# Patient Record
Sex: Female | Born: 1990
Health system: Southern US, Community
[De-identification: ages and names within clinical notes are randomized; demographics above are authoritative.]

## PROBLEM LIST (undated history)

## (undated) ENCOUNTER — Inpatient Hospital Stay (HOSPITAL_COMMUNITY): Payer: Self-pay

## (undated) DIAGNOSIS — E039 Hypothyroidism, unspecified: Secondary | ICD-10-CM

## (undated) DIAGNOSIS — J020 Streptococcal pharyngitis: Secondary | ICD-10-CM

## (undated) DIAGNOSIS — F419 Anxiety disorder, unspecified: Secondary | ICD-10-CM

## (undated) DIAGNOSIS — O24419 Gestational diabetes mellitus in pregnancy, unspecified control: Secondary | ICD-10-CM

## (undated) DIAGNOSIS — F329 Major depressive disorder, single episode, unspecified: Secondary | ICD-10-CM

## (undated) DIAGNOSIS — F32A Depression, unspecified: Secondary | ICD-10-CM

## (undated) HISTORY — DX: Depression, unspecified: F32.A

## (undated) HISTORY — DX: Major depressive disorder, single episode, unspecified: F32.9

## (undated) HISTORY — PX: INTRAUTERINE DEVICE (IUD) INSERTION: SHX5877

## (undated) HISTORY — DX: Streptococcal pharyngitis: J02.0

## (undated) HISTORY — DX: Gestational diabetes mellitus in pregnancy, unspecified control: O24.419

## (undated) HISTORY — DX: Anxiety disorder, unspecified: F41.9

---
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
## 2011-02-09 ENCOUNTER — Emergency Department: Payer: Self-pay | Admitting: Emergency Medicine

## 2011-05-07 ENCOUNTER — Emergency Department: Payer: Self-pay | Admitting: Emergency Medicine

## 2011-09-16 ENCOUNTER — Observation Stay: Payer: Self-pay | Admitting: Obstetrics and Gynecology

## 2011-09-20 ENCOUNTER — Observation Stay: Payer: Self-pay

## 2011-09-21 ENCOUNTER — Observation Stay: Payer: Self-pay

## 2011-09-22 ENCOUNTER — Inpatient Hospital Stay (HOSPITAL_COMMUNITY)
Admission: AD | Admit: 2011-09-22 | Discharge: 2011-09-22 | Disposition: A | Discharge status: Home / Self Care | Payer: Medicaid Other | Source: Ambulatory Visit | Attending: Obstetrics & Gynecology | Admitting: Obstetrics & Gynecology

## 2011-09-22 ENCOUNTER — Encounter (HOSPITAL_COMMUNITY): Payer: Self-pay

## 2011-09-22 DIAGNOSIS — O368190 Decreased fetal movements, unspecified trimester, not applicable or unspecified: Secondary | ICD-10-CM

## 2011-09-22 MED ORDER — DEXTROSE 5 % IN LACTATED RINGERS IV BOLUS: 1000.0000 mL | Freq: Once | INTRAVENOUS | Status: DC

## 2011-09-22 NOTE — Progress Notes (Signed)
Pt was at [facility] last pm for sharp ctx's, cervix remained at 4cm/75/vertex. Pt noting bloody show. Since IM phenergan and morphine, pt states has had decreased fm. +FHT's in triage. Today, u/c's inconsistent. Advised by parents to come here for eval if morphine okay with pregnancy.

## 2011-09-22 NOTE — ED Provider Notes (Signed)
History     Chief Complaint  Patient presents with  . Non-stress Test   HPI  Patient presents to MAU for decreased fetal movement.  She was seen at [facility] last night around 10:30pm for painful contractions.  ED physician gave her one shot of morphine and phenergan and patient was discharged home.  Patient is concerned that morphine caused decreased fetal kicking and wants baby to be monitored.  She has tried not kick counts at home.  No other associated complaints.  Denies any fever, chills, N/V, night sweats.  Denies any vaginal bleeding (except minimal bloody show), fluid leakage, pelvic pressure, difficulty breathing.  OB History    Grav Para Term Preterm Abortions TAB SAB Ect Mult Living   1               Past Medical History  Diagnosis Date  . No pertinent past medical history     Past Surgical History  Procedure Date  . No past surgeries     No family history on file.  History  Substance Use Topics  . Smoking status: Never Smoker   . Smokeless tobacco: Not on file  . Alcohol Use: No    Allergies: No Known Allergies  Prescriptions prior to admission  Medication Sig Dispense Refill  . prenatal vitamin w/FE, FA (PRENATAL 1 + 1) 27-1 MG TABS Take 1 tablet by mouth daily.          ROS  Per HPI  Physical Exam   Blood pressure 134/95, pulse 96, temperature 98.6 F (37 C), temperature source Oral, resp. rate 18, height 5\' 2"  (1.575 m), weight 73.483 kg (162 lb).  Physical Exam  Constitutional: No distress.  HENT:  Head: Normocephalic and atraumatic.  Mouth/Throat: Oropharynx is clear and moist.  Cardiovascular: Normal rate, regular rhythm, normal heart sounds and intact distal pulses.  Exam reveals no gallop and no friction rub.   No murmur heard. Respiratory: Effort normal and breath sounds normal. She has no wheezes. She has no rales.  GI: Bowel sounds are normal. She exhibits no distension. There is no tenderness.       Gravid    Musculoskeletal: Normal range of motion. She exhibits no edema and no tenderness.  Skin: Skin is warm and dry. No rash noted. No erythema.  Cervical exam: 4/60/-3; no vaginal bleeding appreciated  MAU Course  Procedures NST  Assessment and Plan  1) Decreased fetal movement: after reviewing kick counts with patient, she counted 20+ kicks within 1 hour; reassured mother that morphine is out of her system, advised her to continue kick counts at home 2) Reassuring FHT: after giving sugary soda, fetal movements picked up and moderate variability 3) Disposition: discharge home with close OB follow up this week  DE LA CRUZ,IVY 09/22/2011, 3:06 PM

## 2011-09-22 NOTE — ED Provider Notes (Signed)
I have seen and examined this patient in conjunction with Dr Tye Savoy, PGY2.  I have taken this history and performed the exam.  I agree with the note as written above and have made corrections as needed.   Abigail Lowe 09/22/2011 4:34 PM

## 2011-09-30 ENCOUNTER — Inpatient Hospital Stay: Payer: Self-pay

## 2012-01-04 ENCOUNTER — Emergency Department: Payer: Self-pay | Admitting: Emergency Medicine

## 2012-01-04 LAB — URINALYSIS, COMPLETE
Bacteria: NONE SEEN
Bilirubin,UR: NEGATIVE
Blood: NEGATIVE
Glucose,UR: NEGATIVE mg/dL (ref 0–75)
Ketone: NEGATIVE
Leukocyte Esterase: NEGATIVE
Nitrite: NEGATIVE
Ph: 6 (ref 4.5–8.0)
Protein: NEGATIVE
RBC,UR: 1 /HPF (ref 0–5)
Specific Gravity: 1.011 (ref 1.003–1.030)
Squamous Epithelial: 1
WBC UR: 1 /HPF (ref 0–5)

## 2012-01-04 LAB — COMPREHENSIVE METABOLIC PANEL
Albumin: 3.8 g/dL (ref 3.4–5.0)
Alkaline Phosphatase: 76 U/L (ref 50–136)
Anion Gap: 10 (ref 7–16)
BUN: 8 mg/dL (ref 7–18)
Bilirubin,Total: 0.2 mg/dL (ref 0.2–1.0)
Calcium, Total: 8.7 mg/dL (ref 8.5–10.1)
Chloride: 103 mmol/L (ref 98–107)
Co2: 27 mmol/L (ref 21–32)
Creatinine: 0.54 mg/dL — ABNORMAL LOW (ref 0.60–1.30)
EGFR (African American): >60
EGFR (Non-African Amer.): >60
Glucose: 108 mg/dL — ABNORMAL HIGH (ref 65–99)
Osmolality: 278 (ref 275–301)
Potassium: 3.7 mmol/L (ref 3.5–5.1)
SGOT(AST): 29 U/L (ref 15–37)
SGPT (ALT): 26 U/L
Sodium: 140 mmol/L (ref 136–145)
Total Protein: 8 g/dL (ref 6.4–8.2)

## 2012-01-04 LAB — CBC
HCT: 37.3 % (ref 35.0–47.0)
HGB: 12.3 g/dL (ref 12.0–16.0)
MCH: 23.9 pg — ABNORMAL LOW (ref 26.0–34.0)
MCHC: 32.8 g/dL (ref 32.0–36.0)
MCV: 73 fL — ABNORMAL LOW (ref 80–100)
Platelet: 334 10*3/uL (ref 150–440)
RBC: 5.14 10*6/uL (ref 3.80–5.20)
RDW: 15.1 % — ABNORMAL HIGH (ref 11.5–14.5)
WBC: 7 10*3/uL (ref 3.6–11.0)

## 2012-01-04 LAB — LIPASE, BLOOD: Lipase: 146 U/L (ref 73–393)

## 2012-01-04 LAB — PREGNANCY, URINE: Pregnancy Test, Urine: NEGATIVE m[IU]/mL

## 2012-05-23 ENCOUNTER — Emergency Department (HOSPITAL_COMMUNITY)
Admission: EM | Admit: 2012-05-23 | Discharge: 2012-05-23 | Disposition: A | Discharge status: Home / Self Care | Payer: Self-pay | Attending: Emergency Medicine | Admitting: Emergency Medicine

## 2012-05-23 ENCOUNTER — Encounter (HOSPITAL_COMMUNITY): Payer: Self-pay | Admitting: *Deleted

## 2012-05-23 DIAGNOSIS — J029 Acute pharyngitis, unspecified: Secondary | ICD-10-CM | POA: Insufficient documentation

## 2012-05-23 MED ORDER — HYDROCOD POLST-CHLORPHEN POLST 10-8 MG/5ML PO LQCR: 5.0000 mL | Freq: Two times a day (BID) | ORAL | Status: DC | PRN

## 2012-05-23 NOTE — ED Provider Notes (Signed)
Medical screening examination/treatment/procedure(s) were performed by non-physician practitioner and as supervising physician I was immediately available for consultation/collaboration.    Nelia Shi, MD 05/23/12 [phone]

## 2012-05-23 NOTE — ED Notes (Signed)
Pt reports having a cough and sore throat, has lost her voice due to frequent coughing. Airway intact, no distress noted at triage.

## 2012-05-23 NOTE — ED Provider Notes (Signed)
History     CSN: [id number]  Arrival date & time 05/23/12  4098   First MD Initiated Contact with Patient 05/23/12 [phone]      Chief Complaint  Patient presents with  . Cough    (Consider location/radiation/quality/duration/timing/severity/associated sxs/prior treatment) Patient is a 21 y.o. female presenting with cough. The history is provided by the patient.  Cough This is a new problem. The current episode started more than 2 days ago. The problem occurs every few minutes. The problem has not changed since onset.The cough is productive of sputum (unable to describe). There has been no fever. Associated symptoms include sore throat. Pertinent negatives include no chest pain, no chills, no sweats, no ear congestion, no ear pain, no headaches, no rhinorrhea, no myalgias, no shortness of breath, no wheezing and no eye redness. She has tried decongestants for the symptoms. The treatment provided no relief. She is not a smoker. Her past medical history does not include asthma.    Past Medical History  Diagnosis Date  . No pertinent past medical history     Past Surgical History  Procedure Date  . No past surgeries     History reviewed. No pertinent family history.  History  Substance Use Topics  . Smoking status: Never Smoker   . Smokeless tobacco: Not on file  . Alcohol Use: No    OB History    Grav Para Term Preterm Abortions TAB SAB Ect Mult Living   1               Review of Systems  Constitutional: Negative for chills.  HENT: Positive for sore throat and voice change. Negative for ear pain and rhinorrhea.   Eyes: Negative for redness.  Respiratory: Positive for cough. Negative for shortness of breath and wheezing.   Cardiovascular: Negative for chest pain.  Musculoskeletal: Negative for myalgias.  Neurological: Negative for headaches.    Allergies  Review of patient's allergies indicates no known allergies.  Home Medications   Current Outpatient Rx  Name  Route Sig Dispense Refill  . GUAIFENESIN ER 600 MG PO TB12 Oral Take 1,200 mg by mouth 3 (three) times daily as needed.      BP 126/77  Pulse 93  Temp 98.2 F (36.8 C) (Oral)  Resp 18  SpO2 100%  Breastfeeding? Unknown  Physical Exam  Nursing note reviewed. Constitutional: She appears well-developed and well-nourished.       Vital signs are reviewed and are normal. Uncomfortable appearing.  HENT:  Head: Normocephalic and atraumatic. No trismus in the jaw.  Right Ear: Tympanic membrane and ear canal normal.  Left Ear: Tympanic membrane and ear canal normal.  Nose: Right sinus exhibits maxillary sinus tenderness (mild). Right sinus exhibits no frontal sinus tenderness. Left sinus exhibits no maxillary sinus tenderness and no frontal sinus tenderness.  Mouth/Throat: Uvula is midline and mucous membranes are normal. No uvula swelling. No oropharyngeal exudate or tonsillar abscesses.    Eyes: Conjunctivae are normal. Pupils are equal, round, and reactive to light.  Neck: Neck supple.       Voice hoarse  Cardiovascular: Normal rate, regular rhythm and normal heart sounds.   Pulmonary/Chest: Effort normal and breath sounds normal. No respiratory distress. She has no wheezes.  Musculoskeletal: She exhibits no edema.  Lymphadenopathy:    She has no cervical adenopathy.  Neurological: She is alert.  Skin: Skin is warm and dry.    ED Course  Procedures (including critical care time)  Labs Reviewed -  No data to display No results found.   Dx 1: Pharyngitis   MDM  Sore throat with cough x 6 days, voice loss yesterday. No fever, SOB, hypoxia to suggest pneumonia. No exudate, fever, cervical LAD to suggest strep pharyngitis. Minimal right maxillary TTP- doubt severe sinusitis as cause of sx. Likely viral illness exacerbated by dry air in home from [facility]. Discussed plan with pt to include cough suppressant for use at night, benzocaine throat lozenges, salt water gargles, liquid benadryl.          [address] [place], [place] 05/23/12 [phone]

## 2012-12-17 ENCOUNTER — Emergency Department (HOSPITAL_COMMUNITY)
Admission: EM | Admit: 2012-12-17 | Discharge: 2012-12-18 | Disposition: A | Discharge status: Home / Self Care | Payer: Self-pay | Attending: Emergency Medicine | Admitting: Emergency Medicine

## 2012-12-17 ENCOUNTER — Encounter (HOSPITAL_COMMUNITY): Payer: Self-pay | Admitting: *Deleted

## 2012-12-17 DIAGNOSIS — R110 Nausea: Secondary | ICD-10-CM | POA: Insufficient documentation

## 2012-12-17 DIAGNOSIS — R300 Dysuria: Secondary | ICD-10-CM | POA: Insufficient documentation

## 2012-12-17 DIAGNOSIS — Z3202 Encounter for pregnancy test, result negative: Secondary | ICD-10-CM | POA: Insufficient documentation

## 2012-12-17 DIAGNOSIS — N949 Unspecified condition associated with female genital organs and menstrual cycle: Secondary | ICD-10-CM | POA: Insufficient documentation

## 2012-12-17 DIAGNOSIS — R102 Pelvic and perineal pain: Secondary | ICD-10-CM

## 2012-12-17 NOTE — ED Notes (Signed)
Lower abd pain for 2 days with nausea.  lmp jan mid iud

## 2012-12-18 ENCOUNTER — Emergency Department (HOSPITAL_COMMUNITY): Payer: Self-pay

## 2012-12-18 ENCOUNTER — Encounter (HOSPITAL_COMMUNITY): Payer: Self-pay | Admitting: Emergency Medicine

## 2012-12-18 LAB — URINALYSIS, ROUTINE W REFLEX MICROSCOPIC
Bilirubin Urine: NEGATIVE
Glucose, UA: NEGATIVE mg/dL
Hgb urine dipstick: NEGATIVE
Ketones, ur: NEGATIVE mg/dL
Leukocytes, UA: NEGATIVE
Nitrite: NEGATIVE
Protein, ur: NEGATIVE mg/dL
Specific Gravity, Urine: 1.024 (ref 1.005–1.030)
Urobilinogen, UA: 0.2 mg/dL (ref 0.0–1.0)
pH: 6 (ref 5.0–8.0)

## 2012-12-18 LAB — CBC WITH DIFFERENTIAL/PLATELET
Basophils Absolute: 0 10*3/uL (ref 0.0–0.1)
Basophils Relative: 1 % (ref 0–1)
Eosinophils Absolute: 0.2 10*3/uL (ref 0.0–0.7)
Eosinophils Relative: 3 % (ref 0–5)
HCT: 42.6 % (ref 36.0–46.0)
Hemoglobin: 14.1 g/dL (ref 12.0–15.0)
Lymphocytes Relative: 38 % (ref 12–46)
Lymphs Abs: 2.9 10*3/uL (ref 0.7–4.0)
MCH: 27.9 pg (ref 26.0–34.0)
MCHC: 33.1 g/dL (ref 30.0–36.0)
MCV: 84.2 fL (ref 78.0–100.0)
Monocytes Absolute: 0.6 10*3/uL (ref 0.1–1.0)
Monocytes Relative: 7 % (ref 3–12)
Neutro Abs: 4 10*3/uL (ref 1.7–7.7)
Neutrophils Relative %: 52 % (ref 43–77)
Platelets: 283 10*3/uL (ref 150–400)
RBC: 5.06 MIL/uL (ref 3.87–5.11)
RDW: 12.4 % (ref 11.5–15.5)
WBC: 7.7 10*3/uL (ref 4.0–10.5)

## 2012-12-18 LAB — COMPREHENSIVE METABOLIC PANEL
ALT: 15 U/L (ref 0–35)
AST: 22 U/L (ref 0–37)
Albumin: 4.2 g/dL (ref 3.5–5.2)
Alkaline Phosphatase: 91 U/L (ref 39–117)
BUN: 11 mg/dL (ref 6–23)
CO2: 28 mEq/L (ref 19–32)
Calcium: 10.2 mg/dL (ref 8.4–10.5)
Chloride: 99 mEq/L (ref 96–112)
Creatinine, Ser: 0.68 mg/dL (ref 0.50–1.10)
GFR calc Af Amer: >90 mL/min (ref >90)
GFR calc non Af Amer: >90 mL/min (ref >90)
Glucose, Bld: 123 mg/dL — ABNORMAL HIGH (ref 70–99)
Potassium: 3.8 mEq/L (ref 3.5–5.1)
Sodium: 138 mEq/L (ref 135–145)
Total Bilirubin: 0.2 mg/dL — ABNORMAL LOW (ref 0.3–1.2)
Total Protein: 8.7 g/dL — ABNORMAL HIGH (ref 6.0–8.3)

## 2012-12-18 LAB — WET PREP, GENITAL
Clue Cells Wet Prep HPF POC: NONE SEEN
Trich, Wet Prep: NONE SEEN
Yeast Wet Prep HPF POC: NONE SEEN

## 2012-12-18 LAB — PREGNANCY, URINE: Preg Test, Ur: NEGATIVE

## 2012-12-18 MED ORDER — KETOROLAC TROMETHAMINE 30 MG/ML IJ SOLN
30.0000 mg | Freq: Once | INTRAMUSCULAR | Status: AC
  Administered 2012-12-18: 30 mg via INTRAVENOUS
  Filled 2012-12-18: qty 1

## 2012-12-18 MED ORDER — HYDROCODONE-ACETAMINOPHEN 5-325 MG PO TABS: ORAL_TABLET | ORAL | Status: DC

## 2012-12-18 MED ORDER — KETOROLAC TROMETHAMINE 60 MG/2ML IM SOLN
60.0000 mg | Freq: Once | INTRAMUSCULAR | Status: DC
  Filled 2012-12-18: qty 2

## 2012-12-18 MED ORDER — PROMETHAZINE HCL 25 MG PO TABS: 25.0000 mg | ORAL_TABLET | Freq: Four times a day (QID) | ORAL | Status: DC | PRN

## 2012-12-18 MED ORDER — SODIUM CHLORIDE 0.9 % IV SOLN
Freq: Once | INTRAVENOUS | Status: AC
  Administered 2012-12-18: 01:00:00 via INTRAVENOUS

## 2012-12-18 NOTE — ED Notes (Signed)
Pt transported to US

## 2012-12-18 NOTE — ED Notes (Signed)
Pelvic cart by the bedside. 

## 2012-12-18 NOTE — ED Notes (Signed)
Pt unable to give urine specimen at this time. States that she might in a few minutes.

## 2012-12-18 NOTE — ED Notes (Signed)
Pt requested to have toradol IV instead of IM. Dr. Oletta Lamas notified for verbal order.

## 2012-12-18 NOTE — ED Provider Notes (Signed)
History     CSN: [id number]  Arrival date & time 12/17/12  2338   First MD Initiated Contact with Patient 12/18/12 0010      Chief Complaint  Patient presents with  . Abdominal Pain    (Consider location/radiation/quality/duration/timing/severity/associated sxs/prior treatment) HPI Comments: Patient reports 2 day history of gradual onset right lower abdominal and groin discomfort right at her hip flexure crease area. She reports no known association with GYN symptoms. She does have a Mirena IUD in place and has had irregular menses since then. She reports she's had some mild nausea but no vomiting. There's been no decrease in her appetite has been eating normally. She denies diarrhea. She reports no vaginal discharge or bleeding. She denies constipation. She reports bending at the hip and walking seems to exacerbate the pain. She has been taking ibuprofen and reports some transient relief of her pain but once the ibuprofen wears off the pain is back again. She has been pregnant one time and has a child. The child is approximately 32-year-old. Her IUD was placed about one year ago in [place]. Patient's significant other in the room asked me if we're able to remove it here in emergency department.  Patient is a 22 y.o. female presenting with abdominal pain. The history is provided by the patient.  Abdominal Pain Associated symptoms: dysuria and nausea   Associated symptoms: no chills, no constipation, no diarrhea, no fever and no vomiting     Past Medical History  Diagnosis Date  . No pertinent past medical history     Past Surgical History  Procedure Laterality Date  . No past surgeries    . Intrauterine device (iud) insertion      History reviewed. No pertinent family history.  History  Substance Use Topics  . Smoking status: Never Smoker   . Smokeless tobacco: Not on file  . Alcohol Use: No    OB History   Grav Para Term Preterm Abortions TAB SAB Ect Mult Living   1                Review of Systems  Constitutional: Negative for fever, chills and appetite change.  Gastrointestinal: Positive for nausea and abdominal pain. Negative for vomiting, diarrhea and constipation.  Genitourinary: Positive for dysuria and pelvic pain. Negative for urgency, frequency, flank pain and difficulty urinating.  Musculoskeletal: Negative for back pain.  Skin: Negative for rash.  All other systems reviewed and are negative.    Allergies  Review of patient's allergies indicates no known allergies.  Home Medications   Current Outpatient Rx  Name  Route  Sig  Dispense  Refill  . ibuprofen (ADVIL,MOTRIN) 200 MG tablet   Oral   Take 200 mg by mouth every 6 (six) hours as needed for pain.         Marland Kitchen HYDROcodone-acetaminophen (NORCO/VICODIN) 5-325 MG per tablet      1-2 tablets po q 6 hours prn moderate to severe pain   15 tablet   0   . promethazine (PHENERGAN) 25 MG tablet   Oral   Take 1 tablet (25 mg total) by mouth every 6 (six) hours as needed for nausea.   20 tablet   0     BP 118/81  Pulse 96  Temp(Src) 98.1 F (36.7 C) (Oral)  Resp 16  SpO2 99%  Physical Exam  Nursing note and vitals reviewed. Constitutional: She appears well-developed and well-nourished. No distress.  HENT:  Head: Normocephalic and atraumatic.  Pulmonary/Chest:  Effort normal.  Abdominal: Soft. Bowel sounds are normal. She exhibits no distension. There is no tenderness. There is no rebound and no guarding.  Genitourinary: Pelvic exam was performed with patient prone. There is no rash or tenderness on the right labia. There is no rash or tenderness on the left labia. Uterus is tender. Cervix exhibits motion tenderness. Cervix exhibits no friability. Right adnexum displays tenderness. There is tenderness around the vagina. No bleeding around the vagina. No foreign body around the vagina.  Chaperone present during pelvic examination. Strain from IUD was seen coming out of her os.  Mild discharge was present at posterior vaginal vault and around the os. Unable to tell if it was a resident from the os.    ED Course  Procedures (including critical care time)  Labs Reviewed  WET PREP, GENITAL - Abnormal; Notable for the following:    WBC, Wet Prep HPF POC FEW (*)    All other components within normal limits  COMPREHENSIVE METABOLIC PANEL - Abnormal; Notable for the following:    Glucose, Bld 123 (*)    Total Protein 8.7 (*)    Total Bilirubin 0.2 (*)    All other components within normal limits  GC/CHLAMYDIA PROBE AMP  CBC WITH DIFFERENTIAL  URINALYSIS, ROUTINE W REFLEX MICROSCOPIC  PREGNANCY, URINE   US Transvaginal Non-ob  12/18/2012  *RADIOLOGY REPORT*  Clinical Data: Pelvic pain  TRANSABDOMINAL AND TRANSVAGINAL ULTRASOUND OF PELVIS Technique:  Both transabdominal and transvaginal ultrasound examinations of the pelvis were performed. Transabdominal technique was performed for global imaging of the pelvis including uterus, ovaries, adnexal regions, and pelvic cul-de-sac.  It was necessary to proceed with endovaginal exam following the transabdominal exam to visualize the endometrium and adnexa.  Comparison:  None  Findings:  Uterus: Normal in size and appearance  Endometrium: Normal in thickness and appearance, IUD in place.  A small of fluid within the endometrial canal.  Right ovary:  Normal appearance/no adnexal mass.  Measures 4.1 x 1.8 x 2.0 cm.  Left ovary: Normal appearance/no adnexal mass.  Measures 4.2 x 2.1 x 3.1 cm.  Other findings: No free fluid  Color Doppler flow with arterial and venous wave forms documented bilaterally.  IMPRESSION: Normal sonographic appearance to the ovaries.  Color Doppler flow with arterial and venous wave forms documented bilaterally.  IUD in place.   Original Report Authenticated By: Jearld Lesch, M.D.    US Pelvis Complete  12/18/2012  *RADIOLOGY REPORT*  Clinical Data: Pelvic pain  TRANSABDOMINAL AND TRANSVAGINAL ULTRASOUND OF  PELVIS Technique:  Both transabdominal and transvaginal ultrasound examinations of the pelvis were performed. Transabdominal technique was performed for global imaging of the pelvis including uterus, ovaries, adnexal regions, and pelvic cul-de-sac.  It was necessary to proceed with endovaginal exam following the transabdominal exam to visualize the endometrium and adnexa.  Comparison:  None  Findings:  Uterus: Normal in size and appearance  Endometrium: Normal in thickness and appearance, IUD in place.  A small of fluid within the endometrial canal.  Right ovary:  Normal appearance/no adnexal mass.  Measures 4.1 x 1.8 x 2.0 cm.  Left ovary: Normal appearance/no adnexal mass.  Measures 4.2 x 2.1 x 3.1 cm.  Other findings: No free fluid  Color Doppler flow with arterial and venous wave forms documented bilaterally.  IMPRESSION: Normal sonographic appearance to the ovaries.  Color Doppler flow with arterial and venous wave forms documented bilaterally.  IUD in place.   Original Report Authenticated By: Jearld Lesch,  M.D.    [place] Art/ven Flow Abd Pelv Doppler  12/18/2012  *RADIOLOGY REPORT*  Clinical Data: Pelvic pain  TRANSABDOMINAL AND TRANSVAGINAL ULTRASOUND OF PELVIS Technique:  Both transabdominal and transvaginal ultrasound examinations of the pelvis were performed. Transabdominal technique was performed for global imaging of the pelvis including uterus, ovaries, adnexal regions, and pelvic cul-de-sac.  It was necessary to proceed with endovaginal exam following the transabdominal exam to visualize the endometrium and adnexa.  Comparison:  None  Findings:  Uterus: Normal in size and appearance  Endometrium: Normal in thickness and appearance, IUD in place.  A small of fluid within the endometrial canal.  Right ovary:  Normal appearance/no adnexal mass.  Measures 4.1 x 1.8 x 2.0 cm.  Left ovary: Normal appearance/no adnexal mass.  Measures 4.2 x 2.1 x 3.1 cm.  Other findings: No free fluid  Color Doppler flow  with arterial and venous wave forms documented bilaterally.  IMPRESSION: Normal sonographic appearance to the ovaries.  Color Doppler flow with arterial and venous wave forms documented bilaterally.  IUD in place.   Original Report Authenticated By: Jearld Lesch, M.D.      1. Pelvic pain     ra sat is 100% and I interpret to be normal.  2:47 AM Patient feels a little improved after her IV Toradol. Repeat abdominal exam again shows no guarding or rebound and no tenderness at McBurney's point. I discussed at length symptoms for return such as pain in the right lower quadrant, fevers, vomiting. I have recommended that she followup with her OB/GYN for reexamination and consideration of removing her IUD if she so desires. She understands that cultures have been sent and that she'll be contacted for any positive results. Otherwise uncomfortable giving her some mild analgesics for a couple of days and she can continue taking her ibuprofen at home as well.  MDM   Patient with right deep lower abdominal discomfort. Not tender at McBurney's point. No other significant symptoms or clinical suspicion for acute appendicitis. Very tender on pelvic examination and bimanual examination was on the right side. Plan is to give a pelvic ultrasound to assess for torsion versus ovarian cyst of the patient's severity and lack of nausea makes torsion less likely.        Gavin Pound. Oletta Lamas, MD 12/18/12 [phone]

## 2012-12-18 NOTE — ED Notes (Signed)
Pt returned from US

## 2012-12-18 NOTE — ED Notes (Signed)
Pt ambulated to restroom. 

## 2012-12-18 NOTE — ED Notes (Signed)
Pt ambulated to restroom for urine specimen.

## 2012-12-20 LAB — GC/CHLAMYDIA PROBE AMP
CT Probe RNA: NEGATIVE
GC Probe RNA: NEGATIVE

## 2014-03-22 ENCOUNTER — Encounter (HOSPITAL_COMMUNITY): Payer: Self-pay | Admitting: *Deleted

## 2014-03-22 ENCOUNTER — Inpatient Hospital Stay (HOSPITAL_COMMUNITY)
Admission: AD | Admit: 2014-03-22 | Discharge: 2014-03-22 | Disposition: A | Discharge status: Home / Self Care | Payer: Medicaid Other | Source: Ambulatory Visit | Attending: Family Medicine | Admitting: Family Medicine

## 2014-03-22 DIAGNOSIS — R109 Unspecified abdominal pain: Secondary | ICD-10-CM | POA: Insufficient documentation

## 2014-03-22 DIAGNOSIS — B373 Candidiasis of vulva and vagina: Secondary | ICD-10-CM | POA: Insufficient documentation

## 2014-03-22 DIAGNOSIS — B3731 Acute candidiasis of vulva and vagina: Secondary | ICD-10-CM

## 2014-03-22 DIAGNOSIS — O2390 Unspecified genitourinary tract infection in pregnancy, unspecified trimester: Secondary | ICD-10-CM | POA: Insufficient documentation

## 2014-03-22 DIAGNOSIS — O26899 Other specified pregnancy related conditions, unspecified trimester: Secondary | ICD-10-CM

## 2014-03-22 LAB — URINALYSIS, ROUTINE W REFLEX MICROSCOPIC
Bilirubin Urine: NEGATIVE
Glucose, UA: NEGATIVE mg/dL
Hgb urine dipstick: NEGATIVE
Ketones, ur: 15 mg/dL — AB
Leukocytes, UA: NEGATIVE
Nitrite: NEGATIVE
Protein, ur: NEGATIVE mg/dL
Specific Gravity, Urine: 1.02 (ref 1.005–1.030)
Urobilinogen, UA: 0.2 mg/dL (ref 0.0–1.0)
pH: 8.5 — ABNORMAL HIGH (ref 5.0–8.0)

## 2014-03-22 LAB — WET PREP, GENITAL
Clue Cells Wet Prep HPF POC: NONE SEEN
Trich, Wet Prep: NONE SEEN
Yeast Wet Prep HPF POC: NONE SEEN

## 2014-03-22 MED ORDER — FLUCONAZOLE 150 MG PO TABS: 150.0000 mg | ORAL_TABLET | Freq: Once | ORAL | Status: DC

## 2014-03-22 MED ORDER — FLUCONAZOLE 150 MG PO TABS
150.0000 mg | ORAL_TABLET | ORAL | Status: AC
Start: 2014-03-22 — End: 2014-03-22
  Administered 2014-03-22: 150 mg via ORAL
  Filled 2014-03-22: qty 1

## 2014-03-22 NOTE — MAU Provider Note (Signed)
Attestation of Attending Supervision of Advanced Practitioner (PA/CNM/NP): Evaluation and management procedures were performed by the Advanced Practitioner under my supervision and collaboration.  I have reviewed the Advanced Practitioner's note and chart, and I agree with the management and plan.  Tanya S Pratt, MD [facility] Attending 03/22/2014 4:38 PM   

## 2014-03-22 NOTE — MAU Note (Addendum)
Pt states she started having abdominal cramping off and on for about 1 wk. Pt denies bleeding when she wipes only sees red when she voids

## 2014-03-22 NOTE — Discharge Instructions (Signed)
Yeast Vaginitis Vaginitis in a soreness, swelling and redness (inflammation) of the vagina and vulva. Monilial vaginitis is not a sexually transmitted infection. CAUSES  Yeast vaginitis is caused by yeast (candida) that is normally found in your vagina. With a yeast infection, the candida has overgrown in number to a point that upsets the chemical balance. SYMPTOMS   White, thick vaginal discharge.  Swelling, itching, redness and irritation of the vagina and possibly the lips of the vagina (vulva).  Burning or painful urination.  Painful intercourse. DIAGNOSIS  Things that may contribute to monilial vaginitis are:  Postmenopausal and virginal states.  Pregnancy.  Infections.  Being tired, sick or stressed, especially if you had monilial vaginitis in the past.  Diabetes. Good control will help lower the chance.  Birth control pills.  Tight fitting garments.  Using bubble bath, feminine sprays, douches or deodorant tampons.  Taking certain medications that kill germs (antibiotics).  Sporadic recurrence can occur if you become ill. TREATMENT  Your caregiver will give you medication.  There are several kinds of anti monilial vaginal creams and suppositories specific for monilial vaginitis. For recurrent yeast infections, use a suppository or cream in the vagina 2 times a week, or as directed.  Anti-monilial or steroid cream for the itching or irritation of the vulva may also be used. Get your caregiver's permission.  Painting the vagina with methylene blue solution may help if the monilial cream does not work.  Eating yogurt may help prevent monilial vaginitis. HOME CARE INSTRUCTIONS   Finish all medication as prescribed.  Do not have sex until treatment is completed or after your caregiver tells you it is okay.  Take warm sitz baths.  Do not douche.  Do not use tampons, especially scented ones.  Wear cotton underwear.  Avoid tight pants and panty hose.  Tell  your sexual partner that you have a yeast infection. They should go to their caregiver if they have symptoms such as mild rash or itching.  Your sexual partner should be treated as well if your infection is difficult to eliminate.  Practice safer sex. Use condoms.  Some vaginal medications cause latex condoms to fail. Vaginal medications that harm condoms are:  Cleocin cream.  Butoconazole (Femstat).  Terconazole (Terazol) vaginal suppository.  Miconazole (Monistat) (may be purchased over the counter). SEEK MEDICAL CARE IF:   You have a temperature by mouth above 102 F (38.9 C).  The infection is getting worse after 2 days of treatment.  The infection is not getting better after 3 days of treatment.  You develop blisters in or around your vagina.  You develop vaginal bleeding, and it is not your menstrual period.  You have pain when you urinate.  You develop intestinal problems.  You have pain with sexual intercourse. Document Released: 07/16/2005 Document Revised: 12/29/2011 Document Reviewed: 03/30/2009 [facility] Patient Information 2014 [place], [place].  Abdominal Pain During Pregnancy Abdominal pain is common in pregnancy. Most of the time, it does not cause harm. There are many causes of abdominal pain. Some causes are more serious than others. Some of the causes of abdominal pain in pregnancy are easily diagnosed. Occasionally, the diagnosis takes time to understand. Other times, the cause is not determined. Abdominal pain can be a sign that something is very wrong with the pregnancy, or the pain may have nothing to do with the pregnancy at all. For this reason, always tell your health care provider if you have any abdominal discomfort. HOME CARE INSTRUCTIONS  Monitor your  abdominal pain for any changes. The following actions may help to alleviate any discomfort you are experiencing:  Do not have sexual intercourse or put anything in your vagina until your symptoms  go away completely.  Get plenty of rest until your pain improves.  Drink clear fluids if you feel nauseous. Avoid solid food as [place] as you are uncomfortable or nauseous.  Only take over-the-counter or prescription medicine as directed by your health care provider.  Keep all follow-up appointments with your health care provider. SEEK IMMEDIATE MEDICAL CARE IF:  You are bleeding, leaking fluid, or passing tissue from the vagina.  You have increasing pain or cramping.  You have persistent vomiting.  You have painful or bloody urination.  You have a fever.  You notice a decrease in your baby's movements.  You have extreme weakness or feel faint.  You have shortness of breath, with or without abdominal pain.  You develop a severe headache with abdominal pain.  You have abnormal vaginal discharge with abdominal pain.  You have persistent diarrhea.  You have abdominal pain that continues even after rest, or gets worse. MAKE SURE YOU:   Understand these instructions.  Will watch your condition.  Will get help right away if you are not doing well or get worse. Document Released: 10/06/2005 Document Revised: 07/27/2013 Document Reviewed: 05/05/2013 [facility] Patient Information 2014 [place], [place].

## 2014-03-22 NOTE — MAU Provider Note (Signed)
Chief Complaint: Abdominal Cramping   First Provider Initiated Contact with Patient 03/22/14 1533     SUBJECTIVE HPI: Abigail Lowe is a 23 y.o. G2P0 at [redacted]w[redacted]d by LMP who presents to maternity admissions reporting abdominal cramping x2-3 days with pink noted in the toilet x1 episode when urinating this morning.  She also reports vaginal irritation x2-3 days.  She is receiving prenatal care in [place] and has her next appointment June 29.  She denies LOF, urinary symptoms, h/a, dizziness, n/v, or fever/chills.     Past Medical History  Diagnosis Date  . No pertinent past medical history   . Medical history non-contributory    Past Surgical History  Procedure Laterality Date  . No past surgeries    . Intrauterine device (iud) insertion     History   Social History  . Marital Status: Married    Spouse Name: N/A    Number of Children: N/A  . Years of Education: N/A   Occupational History  . Not on file.   Social History Main Topics  . Smoking status: Never Smoker   . Smokeless tobacco: Not on file  . Alcohol Use: No  . Drug Use: No  . Sexual Activity: Yes    Birth Control/ Protection: IUD   Other Topics Concern  . Not on file   Social History Narrative  . No narrative on file   No current facility-administered medications on file prior to encounter.   No current outpatient prescriptions on file prior to encounter.   Not on File  ROS: Pertinent items in HPI  OBJECTIVE Blood pressure 102/64, pulse 110, temperature 98.5 F (36.9 C), temperature source Oral, resp. rate 16, height 5' 1.5" (1.562 m), weight 67.586 kg (149 lb). GENERAL: Well-developed, well-nourished female in no acute distress.  HEENT: Normocephalic HEART: normal rate RESP: normal effort ABDOMEN: Soft, non-tender EXTREMITIES: Nontender, no edema NEURO: Alert and oriented Pelvic exam: Cervix pink, visually closed, without lesion, large amount white/yellow thick discharge, vaginal walls and external  genitalia with mild erythema Cervix 0/thick/high, posterior, firm  LAB RESULTS Results for orders placed during the hospital encounter of 03/22/14 (from the past 24 hour(s))  URINALYSIS, ROUTINE W REFLEX MICROSCOPIC     Status: Abnormal   Collection Time    03/22/14  2:33 PM      Result Value Ref Range   Color, Urine YELLOW  YELLOW   APPearance HAZY (*) CLEAR   Specific Gravity, Urine 1.020  1.005 - 1.030   pH 8.5 (*) 5.0 - 8.0   Glucose, UA NEGATIVE  NEGATIVE mg/dL   Hgb urine dipstick NEGATIVE  NEGATIVE   Bilirubin Urine NEGATIVE  NEGATIVE   Ketones, ur 15 (*) NEGATIVE mg/dL   Protein, ur NEGATIVE  NEGATIVE mg/dL   Urobilinogen, UA 0.2  0.0 - 1.0 mg/dL   Nitrite NEGATIVE  NEGATIVE   Leukocytes, UA NEGATIVE  NEGATIVE  WET PREP, GENITAL     Status: Abnormal   Collection Time    03/22/14  3:44 PM      Result Value Ref Range   Yeast Wet Prep HPF POC NONE SEEN  NONE SEEN   Trich, Wet Prep NONE SEEN  NONE SEEN   Clue Cells Wet Prep HPF POC NONE SEEN  NONE SEEN   WBC, Wet Prep HPF POC MANY (*) NONE SEEN   ASSESSMENT 1. Vaginal candidiasis   2. Abdominal pain in pregnancy     PLAN Diflucan 150 mg x1 dose in MAU Discharge home  Diflucan 150 mg Rx for one dose in 48 hours Increase PO fluids F/U with prenatal provider as scheduled    Medication List         acetaminophen 325 MG tablet  Commonly known as:  TYLENOL  Take 325 mg by mouth every 6 (six) hours as needed for headache.     fluconazole 150 MG tablet  Commonly known as:  DIFLUCAN  Take 1 tablet (150 mg total) by mouth once.     prenatal multivitamin Tabs tablet  Take 1 tablet by mouth daily at 12 noon.       Follow-up Information   [facility]. (As needed for emergencies)    Contact information:   [address] [id number] [place] [id number] [phone]      Please follow up. (With your prenatal provider in [place])        Sharen CounterLisa Leftwich-Kirby Certified Nurse-Midwife 03/22/2014  4:24 PM

## 2014-03-22 NOTE — MAU Note (Signed)
Urine was almost reddish this morning when got up.  Has been having cramping in lower abd and back for about a wk.  Some pressure with urination.

## 2014-03-23 LAB — GC/CHLAMYDIA PROBE AMP
CT Probe RNA: NEGATIVE
GC Probe RNA: NEGATIVE

## 2014-04-11 ENCOUNTER — Encounter (HOSPITAL_COMMUNITY): Payer: Self-pay

## 2014-06-27 ENCOUNTER — Observation Stay: Payer: Self-pay

## 2014-06-27 LAB — URINALYSIS, COMPLETE
Bacteria: NONE SEEN
Bilirubin,UR: NEGATIVE
Blood: NEGATIVE
Glucose,UR: NEGATIVE mg/dL (ref 0–75)
Ketone: NEGATIVE
Leukocyte Esterase: NEGATIVE
Nitrite: NEGATIVE
Ph: 7 (ref 4.5–8.0)
Protein: NEGATIVE
RBC,UR: 1 /HPF (ref 0–5)
Specific Gravity: 1.003 (ref 1.003–1.030)
Squamous Epithelial: 3
WBC UR: 2 /HPF (ref 0–5)

## 2014-06-29 ENCOUNTER — Ambulatory Visit: Payer: Self-pay | Admitting: Nurse Practitioner

## 2014-07-20 ENCOUNTER — Ambulatory Visit: Payer: Self-pay | Admitting: Nurse Practitioner

## 2014-08-21 ENCOUNTER — Encounter (HOSPITAL_COMMUNITY): Payer: Self-pay

## 2014-08-26 ENCOUNTER — Inpatient Hospital Stay: Payer: Self-pay | Admitting: Certified Nurse Midwife

## 2014-08-26 LAB — CBC WITH DIFFERENTIAL/PLATELET
Basophil #: 0.1 10*3/uL (ref 0.0–0.1)
Basophil %: 0.8 %
Eosinophil #: 0.1 10*3/uL (ref 0.0–0.7)
Eosinophil %: 1.5 %
HCT: 32.8 % — ABNORMAL LOW (ref 35.0–47.0)
HGB: 11.1 g/dL — ABNORMAL LOW (ref 12.0–16.0)
Lymphocyte #: 1.8 10*3/uL (ref 1.0–3.6)
Lymphocyte %: 25 %
MCH: 27.6 pg (ref 26.0–34.0)
MCHC: 33.8 g/dL (ref 32.0–36.0)
MCV: 82 fL (ref 80–100)
Monocyte #: 0.4 x10 3/mm (ref 0.2–0.9)
Monocyte %: 5.7 %
Neutrophil #: 4.7 10*3/uL (ref 1.4–6.5)
Neutrophil %: 67 %
Platelet: 194 10*3/uL (ref 150–440)
RBC: 4.02 10*6/uL (ref 3.80–5.20)
RDW: 15.4 % — ABNORMAL HIGH (ref 11.5–14.5)
WBC: 7 10*3/uL (ref 3.6–11.0)

## 2014-08-26 LAB — GC/CHLAMYDIA PROBE AMP

## 2014-08-27 LAB — HEMATOCRIT: HCT: 31 % — ABNORMAL LOW (ref 35.0–47.0)

## 2015-01-25 ENCOUNTER — Encounter (HOSPITAL_COMMUNITY): Payer: Self-pay | Admitting: *Deleted

## 2015-02-27 NOTE — H&P (Signed)
L&D Evaluation:  History:  HPI 24 year old G2 P1001 with EDC=09/02/2014 by a 12 wk 4 day US presents at 30wk3 days with c/o cramping and sacral back pain x 2 hours PTA. Denies VB, LOF, dysuria, N/V. Baby active. Had a cook-out today, but otherwise no strenuous activiy. Prenatal care remarkable for being RH negative (received Rhogam 8/18), GDM (has [facility] appt this week), a single echogenic foci in left ventricle (negative first and MSAFP test), and a hx of delivering a macrosomic infant with G1. A neg/RNI/VNI   Presents with cramping and back pain   Patient's Medical History GDM    Patient's Surgical History none    Medications Pre Natal Vitamins    Allergies NKDA   Social History none    Family History Non-Contributory    ROS:  ROS see HPI   Exam:  Vital Signs 123/78    Urine Protein UA ess negative   General initially appeared uncomfortable holding back   Mental Status clear    Abdomen gravid, non-tender   Estimated Fetal Weight Average for gestational age   Fetal Position cephalic   Back area of pain in sacrum   Pelvic no external lesions, FT/25%/OOP ( no change over 3 hours)   Mebranes Intact   FHT normal rate with no decels, 130s with accels to 150s   Ucx q2 minutes x 40 sec on arrival   Other Contraction pain on arrival 8/10, and did not improve with po hydration.   Impression:  Impression IUP at 30 3/7 weeks with preterm contractions-no cx dilation   Plan:  Plan treated with terbutaline .25 mgm subcut and patient reports that her pain has resolved and desires to go home.   Comments Contractions prior to discharge are irregular x 20-30 sec and patient appears comfortable. [facility]. Preterm labor precautions given.   Electronic Signatures: Trinna BalloonGutierrez, Aubryana Vittorio L (CNM)  (Signed 08-Sep-15 07:44)  Authored: L&D Evaluation   Last Updated: 08-Sep-15 07:44 by Trinna BalloonGutierrez, Monae Topping L (CNM)

## 2016-11-06 ENCOUNTER — Encounter (HOSPITAL_COMMUNITY): Payer: Self-pay | Admitting: Emergency Medicine

## 2016-11-06 ENCOUNTER — Emergency Department (HOSPITAL_COMMUNITY): Payer: Medicaid Other

## 2016-11-06 ENCOUNTER — Emergency Department (HOSPITAL_COMMUNITY)
Admission: EM | Admit: 2016-11-06 | Discharge: 2016-11-06 | Disposition: A | Discharge status: Home / Self Care | Payer: Medicaid Other | Attending: Emergency Medicine | Admitting: Emergency Medicine

## 2016-11-06 DIAGNOSIS — K8020 Calculus of gallbladder without cholecystitis without obstruction: Secondary | ICD-10-CM | POA: Diagnosis not present

## 2016-11-06 DIAGNOSIS — R1013 Epigastric pain: Secondary | ICD-10-CM | POA: Diagnosis present

## 2016-11-06 LAB — CBC
HCT: 40.5 % (ref 36.0–46.0)
Hemoglobin: 13.3 g/dL (ref 12.0–15.0)
MCH: 26.4 pg (ref 26.0–34.0)
MCHC: 32.8 g/dL (ref 30.0–36.0)
MCV: 80.5 fL (ref 78.0–100.0)
Platelets: 288 10*3/uL (ref 150–400)
RBC: 5.03 MIL/uL (ref 3.87–5.11)
RDW: 14.6 % (ref 11.5–15.5)
WBC: 10.4 10*3/uL (ref 4.0–10.5)

## 2016-11-06 LAB — HEPATIC FUNCTION PANEL
ALT: 45 U/L (ref 14–54)
AST: 37 U/L (ref 15–41)
Albumin: 3.9 g/dL (ref 3.5–5.0)
Alkaline Phosphatase: 69 U/L (ref 38–126)
Bilirubin, Direct: <0.1 mg/dL — ABNORMAL LOW (ref 0.1–0.5)
Total Bilirubin: 0.4 mg/dL (ref 0.3–1.2)
Total Protein: 7.5 g/dL (ref 6.5–8.1)

## 2016-11-06 LAB — LIPASE, BLOOD: Lipase: 21 U/L (ref 11–51)

## 2016-11-06 LAB — BASIC METABOLIC PANEL
Anion gap: 9 (ref 5–15)
BUN: 13 mg/dL (ref 6–20)
CO2: 26 mmol/L (ref 22–32)
Calcium: 9.5 mg/dL (ref 8.9–10.3)
Chloride: 104 mmol/L (ref 101–111)
Creatinine, Ser: 0.74 mg/dL (ref 0.44–1.00)
GFR calc Af Amer: >60 mL/min (ref >60)
GFR calc non Af Amer: >60 mL/min (ref >60)
Glucose, Bld: 99 mg/dL (ref 65–99)
Potassium: 3.9 mmol/L (ref 3.5–5.1)
Sodium: 139 mmol/L (ref 135–145)

## 2016-11-06 LAB — POC URINE PREG, ED: Preg Test, Ur: NEGATIVE

## 2016-11-06 LAB — I-STAT TROPONIN, ED: Troponin i, poc: 0 ng/mL (ref 0.00–0.08)

## 2016-11-06 MED ORDER — RANITIDINE HCL 150 MG PO CAPS: 150.0000 mg | ORAL_CAPSULE | Freq: Two times a day (BID) | ORAL | 0 refills | Status: DC

## 2016-11-06 MED ORDER — OMEPRAZOLE 20 MG PO CPDR: 20.0000 mg | DELAYED_RELEASE_CAPSULE | Freq: Two times a day (BID) | ORAL | 0 refills | Status: DC

## 2016-11-06 MED ORDER — GI COCKTAIL ~~LOC~~
30.0000 mL | Freq: Once | ORAL | Status: AC
  Administered 2016-11-06: 30 mL via ORAL
  Filled 2016-11-06: qty 30

## 2016-11-06 NOTE — Discharge Planning (Signed)
EKG without concerning feature; abdominal ultrasound shown sludge and stones in the gallbladder but no gallbladder wall thickening. Therefore, her pain may be due to gallstones versus gastritis. Patient will be discharged with PPI, and H2 blocker. Referral given for general surgery for potential gallbladder etiology as well as GI specialist for outpatient follow-up as needed.   [facility]. [facility].  No needs identified or communicated.

## 2016-11-06 NOTE — ED Triage Notes (Signed)
Pt presents to ER from home with centralized chest pain x 2 days described as burning; pt states she put it off as indigestion but pain came back this morning at 0340 and was worse

## 2016-11-06 NOTE — ED Provider Notes (Signed)
[facility]-EMERGENCY DEPT Provider Note   CSN: [id number] Arrival date & time: 11/06/16  [id number]     History   Chief Complaint No chief complaint on file.   HPI Abigail Lowe is a 26 y.o. female.  HPI   26 year old female without any significant past medical history presenting complaining of chest pain. Patient report for the past 2 nights she has had epigastric abdominal pain and pain to the right upper quadrant. She described the pain as a burning stabbing sensation, felt like indigestion, persistent, lasting for many hours. She also endorsed nausea, and sometimes shortness of breath when the pain is intense. Her pain has improved since earlier last night. The night before she mentioned the pain lasted for approximately 7 hours, and kept her up at night.  She has tried using Imodium, and Pepto-Bismol with minimal relief. She denies any associated fever, chills, productive cough, lightheadedness, dizziness, back pain, dysuria, hematuria, hematochezia or melena. Denies drugs or alcohol use. No history of heartburn or gallbladder problem. No significant family history of cardiac disease or premature cardiac death. She is a nonsmoker. No significant risk factor for PE.  Past Medical History:  Diagnosis Date  . Medical history non-contributory   . No pertinent past medical history     There are no active problems to display for this patient.   Past Surgical History:  Procedure Laterality Date  . INTRAUTERINE DEVICE (IUD) INSERTION    . NO PAST SURGERIES      OB History    Gravida Para Term Preterm AB Living   2         1   SAB TAB Ectopic Multiple Live Births                   Home Medications    Prior to Admission medications   Medication Sig Start Date End Date Taking? Authorizing Provider  acetaminophen (TYLENOL) 325 MG tablet Take 325 mg by mouth every 6 (six) hours as needed for headache.    Historical Provider, MD  fluconazole (DIFLUCAN) 150 MG tablet Take 1 tablet (150 mg  total) by mouth once. 03/22/14   Hurshel PartyLisa A Leftwich-Kirby, CNM  Prenatal Vit-Fe Fumarate-FA (PRENATAL MULTIVITAMIN) TABS tablet Take 1 tablet by mouth daily at 12 noon.    Historical Provider, MD    Family History Family History  Problem Relation Age of Onset  . Hypertension Father   . Heart disease Maternal Grandfather     Social History Social History  Substance Use Topics  . Smoking status: Never Smoker  . Smokeless tobacco: Not on file  . Alcohol use No     Allergies   Patient has no known allergies.   Review of Systems Review of Systems  All other systems reviewed and are negative.    Physical Exam Updated Vital Signs There were no vitals taken for this visit.  Physical Exam  Constitutional: She appears well-developed and well-nourished. No distress.  Mildly obese female in no acute distress.  HENT:  Head: Atraumatic.  Eyes: Conjunctivae are normal.  Neck: Neck supple.  Cardiovascular: Normal rate and regular rhythm.   Pulmonary/Chest: Effort normal and breath sounds normal. No respiratory distress. She has no wheezes. She has no rales. She exhibits no tenderness.  Abdominal: Soft. Bowel sounds are normal. She exhibits no distension. There is tenderness (Tenderness to epigastric and right upper quadrant on palpation without guarding or rebound tenderness. Positive Murphy sign but no pain at McBurney's point.).  Neurological: She is  alert.  Skin: No rash noted.  Psychiatric: She has a normal mood and affect.  Nursing note and vitals reviewed.    ED Treatments / Results  Labs (all labs ordered are listed, but only abnormal results are displayed) Labs Reviewed  HEPATIC FUNCTION PANEL - Abnormal; Notable for the following:       Result Value   Bilirubin, Direct <0.1 (*)    All other components within normal limits  BASIC METABOLIC PANEL  CBC  LIPASE, BLOOD  I-STAT TROPOININ, ED  POC URINE PREG, ED  POC URINE PREG, ED    EKG  EKG  Interpretation  Date/Time:  Thursday November 06 2016 06:56:02 EST Ventricular Rate:  81 PR Interval:    QRS Duration: 103 QT Interval:  363 QTC Calculation: 422 R Axis:   97 Text Interpretation:  Sinus rhythm Borderline right axis deviation Borderline Q waves in inferior leads Borderline T abnormalities, anterior leads No old tracing to compare Confirmed by [facility]  MD, MARTHA [phone]) on 11/06/2016 7:36:42 AM       Radiology Dg Chest 2 View  Result Date: 11/06/2016 CLINICAL DATA:  Chest pain EXAM: CHEST  2 VIEW COMPARISON:  None. FINDINGS: Lungs are clear. Heart size and pulmonary vascularity are normal. No adenopathy. No pneumothorax. No bone lesions. IMPRESSION: No edema or consolidation. Electronically Signed   By: Bretta Bang III M.D.   On: 11/06/2016 07:39   US Abdomen Limited  Result Date: 11/06/2016 CLINICAL DATA:  Right upper quadrant pain EXAM: US ABDOMEN LIMITED - RIGHT UPPER QUADRANT COMPARISON:  12/18/2012 FINDINGS: Gallbladder: The gallbladder wall is within normal limits measuring 2.6 mm. No pericholecystic fluid. Negative sonographic Murphy's sign. 3 mm polyp is noted. There is also a sludge ball identified within the gallbladder fundus and a small stones measuring up to 7 mm. Common bile duct: Diameter: 2.8 mm Liver: No focal lesion identified. Within normal limits in parenchymal echogenicity. IMPRESSION: 1. Gallbladder sludge and stones. 2. No gallbladder wall thickening and negative sonographic Murphy's sign. Electronically Signed   By: Signa Kell M.D.   On: 11/06/2016 08:30    Procedures Procedures (including critical care time)  Medications Ordered in ED Medications  gi cocktail (Maalox,Lidocaine,Donnatal) (30 mLs Oral Given 11/06/16 2130)     Initial Impression / Assessment and Plan / ED Course  I have reviewed the triage vital signs and the nursing notes.  Pertinent labs & imaging results that were available during my care of the patient were reviewed  by me and considered in my medical decision making (see chart for details).     BP 116/69   Pulse 68   Temp 97.8 F (36.6 C) (Oral)   Resp 14   Ht 5\' 6"  (1.676 m)   Wt 68 kg   LMP 10/20/2016   SpO2 100%   BMI 24.21 kg/m    Final Clinical Impressions(s) / ED Diagnoses   Final diagnoses:  Calculus of gallbladder without cholecystitis without obstruction  Acute epigastric pain    New Prescriptions New Prescriptions   OMEPRAZOLE (PRILOSEC) 20 MG CAPSULE    Take 1 capsule (20 mg total) by mouth 2 (two) times daily before a meal.   RANITIDINE (ZANTAC) 150 MG CAPSULE    Take 1 capsule (150 mg total) by mouth 2 (two) times daily.   7:15 AM Patient here with epigastric abdominal pain suggestive of gastritis/reflux. She also has right upper quadrant abdominal pain on palpation which may benefit from a biliary workup including an abdominal  ultrasound. Symptoms does not suggest of ACS or PE.    8:41 AM EKG without concerning feature. Pregnancy test is negative. Troponin is negative. Her labs are reassuring. Chest x-ray without any acute finding, and in her abdominal ultrasound shown sludge and stones in the gallbladder but no gallbladder wall thickening. Therefore, her pain may be due to gallstones versus gastritis. Patient will be discharged with PPI, and H2 blocker. Referral given for general surgery for potential gallbladder etiology as well as GI specialist for outpatient follow-up as needed. Return precaution discussed.   Fayrene Helper, [name] 11/06/16 [id number]    Jerelyn Scott, MD 11/06/16 [phone]

## 2016-11-06 NOTE — ED Notes (Signed)
Pt transported to US

## 2016-11-06 NOTE — ED Notes (Signed)
Pt to xray

## 2016-11-06 NOTE — ED Notes (Signed)
Pt ambulatory w/ steady gait to restroom. 

## 2016-11-23 ENCOUNTER — Encounter (HOSPITAL_COMMUNITY): Payer: Self-pay

## 2016-11-23 ENCOUNTER — Inpatient Hospital Stay (HOSPITAL_COMMUNITY)
Admission: EM | Admit: 2016-11-23 | Discharge: 2016-11-26 | DRG: 419 | Disposition: A | Discharge status: Home / Self Care | Payer: Medicaid Other | Attending: Surgery | Admitting: Surgery

## 2016-11-23 ENCOUNTER — Emergency Department (HOSPITAL_COMMUNITY): Payer: Medicaid Other

## 2016-11-23 DIAGNOSIS — R1011 Right upper quadrant pain: Secondary | ICD-10-CM | POA: Diagnosis present

## 2016-11-23 DIAGNOSIS — K8010 Calculus of gallbladder with chronic cholecystitis without obstruction: Principal | ICD-10-CM | POA: Diagnosis present

## 2016-11-23 DIAGNOSIS — K8020 Calculus of gallbladder without cholecystitis without obstruction: Secondary | ICD-10-CM | POA: Diagnosis present

## 2016-11-23 LAB — CBC
HCT: 40.2 % (ref 36.0–46.0)
Hemoglobin: 13 g/dL (ref 12.0–15.0)
MCH: 25.9 pg — ABNORMAL LOW (ref 26.0–34.0)
MCHC: 32.3 g/dL (ref 30.0–36.0)
MCV: 80.1 fL (ref 78.0–100.0)
Platelets: 294 10*3/uL (ref 150–400)
RBC: 5.02 MIL/uL (ref 3.87–5.11)
RDW: 13.8 % (ref 11.5–15.5)
WBC: 5.7 10*3/uL (ref 4.0–10.5)

## 2016-11-23 LAB — COMPREHENSIVE METABOLIC PANEL
ALT: 29 U/L (ref 14–54)
AST: 51 U/L — ABNORMAL HIGH (ref 15–41)
Albumin: 4.2 g/dL (ref 3.5–5.0)
Alkaline Phosphatase: 97 U/L (ref 38–126)
Anion gap: 10 (ref 5–15)
BUN: 10 mg/dL (ref 6–20)
CO2: 23 mmol/L (ref 22–32)
Calcium: 9.4 mg/dL (ref 8.9–10.3)
Chloride: 103 mmol/L (ref 101–111)
Creatinine, Ser: 0.77 mg/dL (ref 0.44–1.00)
GFR calc Af Amer: >60 mL/min (ref >60)
GFR calc non Af Amer: >60 mL/min (ref >60)
Glucose, Bld: 129 mg/dL — ABNORMAL HIGH (ref 65–99)
Potassium: 3.9 mmol/L (ref 3.5–5.1)
Sodium: 136 mmol/L (ref 135–145)
Total Bilirubin: 0.8 mg/dL (ref 0.3–1.2)
Total Protein: 8 g/dL (ref 6.5–8.1)

## 2016-11-23 LAB — URINALYSIS, ROUTINE W REFLEX MICROSCOPIC
Bacteria, UA: NONE SEEN
Bilirubin Urine: NEGATIVE
Glucose, UA: NEGATIVE mg/dL
Hgb urine dipstick: NEGATIVE
Ketones, ur: 5 mg/dL — AB
Nitrite: NEGATIVE
Protein, ur: NEGATIVE mg/dL
Specific Gravity, Urine: 1.025 (ref 1.005–1.030)
pH: 5 (ref 5.0–8.0)

## 2016-11-23 LAB — LIPASE, BLOOD: Lipase: 44 U/L (ref 11–51)

## 2016-11-23 MED ORDER — HYDROMORPHONE HCL 2 MG/ML IJ SOLN: 1.0000 mg | Freq: Once | INTRAMUSCULAR | Status: DC

## 2016-11-23 MED ORDER — SODIUM CHLORIDE 0.9 % IV BOLUS (SEPSIS)
1000.0000 mL | Freq: Once | INTRAVENOUS | Status: AC
  Administered 2016-11-23: 1000 mL via INTRAVENOUS

## 2016-11-23 MED ORDER — HYDROMORPHONE HCL 2 MG/ML IJ SOLN
0.5000 mg | Freq: Once | INTRAMUSCULAR | Status: AC
  Administered 2016-11-23: 0.5 mg via INTRAVENOUS
  Filled 2016-11-23: qty 1

## 2016-11-23 MED ORDER — ONDANSETRON HCL 4 MG/2ML IJ SOLN
4.0000 mg | Freq: Once | INTRAMUSCULAR | Status: AC
  Administered 2016-11-23: 4 mg via INTRAVENOUS
  Filled 2016-11-23: qty 2

## 2016-11-23 NOTE — ED Triage Notes (Signed)
Pt states that she has upper abd pain that radiates to her chest, pain started about an hour ago, denies n/v, pt with a hx of gallstones.

## 2016-11-23 NOTE — H&P (Signed)
Abigail Lowe is an 26 y.o. female.   Chief Complaint: Right upper quadrant abdominal pain HPI: This is a 26 year old female who presents with right upper quadrant abdominal pain. She has known gallstones seen on ultrasound during an ER visit on January 18. She did not follow-up with our office. She started having abdominal pain again this evening after dinner. She described a sharp epigastric abdominal pain hurting due to the back with mild nausea but no vomiting. Currently she reports her pain is improving. She is otherwise without complaints. The pain was moderate in intensity.  Past Medical History:  Diagnosis Date  . Medical history non-contributory   . No pertinent past medical history     Past Surgical History:  Procedure Laterality Date  . INTRAUTERINE DEVICE (IUD) INSERTION    . NO PAST SURGERIES      Family History  Problem Relation Age of Onset  . Hypertension Father   . Heart disease Maternal Grandfather    Social History:  reports that she has never smoked. She has never used smokeless tobacco. She reports that she does not drink alcohol or use drugs.  Allergies: No Known Allergies   (Not in a hospital admission)  Results for orders placed or performed during the hospital encounter of 11/23/16 (from the past 48 hour(s))  Urinalysis, Routine w reflex microscopic     Status: Abnormal   Collection Time: 11/23/16  7:40 PM  Result Value Ref Range   Color, Urine YELLOW YELLOW   APPearance CLEAR CLEAR   Specific Gravity, Urine 1.025 1.005 - 1.030   pH 5.0 5.0 - 8.0   Glucose, UA NEGATIVE NEGATIVE mg/dL   Hgb urine dipstick NEGATIVE NEGATIVE   Bilirubin Urine NEGATIVE NEGATIVE   Ketones, ur 5 (A) NEGATIVE mg/dL   Protein, ur NEGATIVE NEGATIVE mg/dL   Nitrite NEGATIVE NEGATIVE   Leukocytes, UA TRACE (A) NEGATIVE   RBC / HPF 0-5 0 - 5 RBC/hpf   WBC, UA 0-5 0 - 5 WBC/hpf   Bacteria, UA NONE SEEN NONE SEEN   Squamous Epithelial / LPF 0-5 (A) NONE SEEN   Mucous PRESENT    Lipase, blood     Status: None   Collection Time: 11/23/16  7:42 PM  Result Value Ref Range   Lipase 44 11 - 51 U/L  Comprehensive metabolic panel     Status: Abnormal   Collection Time: 11/23/16  7:42 PM  Result Value Ref Range   Sodium 136 135 - 145 mmol/L   Potassium 3.9 3.5 - 5.1 mmol/L   Chloride 103 101 - 111 mmol/L   CO2 23 22 - 32 mmol/L   Glucose, Bld 129 (H) 65 - 99 mg/dL   BUN 10 6 - 20 mg/dL   Creatinine, Ser 0.77 0.44 - 1.00 mg/dL   Calcium 9.4 8.9 - 10.3 mg/dL   Total Protein 8.0 6.5 - 8.1 g/dL   Albumin 4.2 3.5 - 5.0 g/dL   AST 51 (H) 15 - 41 U/L   ALT 29 14 - 54 U/L   Alkaline Phosphatase 97 38 - 126 U/L   Total Bilirubin 0.8 0.3 - 1.2 mg/dL   GFR calc non Af Amer >60 >60 mL/min   GFR calc Af Amer >60 >60 mL/min    Comment: (NOTE) The eGFR has been calculated using the CKD EPI equation. This calculation has not been validated in all clinical situations. eGFR's persistently <60 mL/min signify possible Chronic Kidney Disease.    Anion gap 10 5 -  15  CBC     Status: Abnormal   Collection Time: 11/23/16  7:42 PM  Result Value Ref Range   WBC 5.7 4.0 - 10.5 K/uL   RBC 5.02 3.87 - 5.11 MIL/uL   Hemoglobin 13.0 12.0 - 15.0 g/dL   HCT 40.2 36.0 - 46.0 %   MCV 80.1 78.0 - 100.0 fL   MCH 25.9 (L) 26.0 - 34.0 pg   MCHC 32.3 30.0 - 36.0 g/dL   RDW 13.8 11.5 - 15.5 %   Platelets 294 150 - 400 K/uL   US Abdomen Limited  Result Date: 11/23/2016 CLINICAL DATA:  Upper abdominal pain which started tonight. EXAM: US ABDOMEN LIMITED - RIGHT UPPER QUADRANT COMPARISON:  11/06/2016 FINDINGS: Gallbladder: Gallbladder wall is upper limits normal in thickness, 3 mm. No sonographic Murphy sign. Sludge is present. Numerous gallstones are present, largest measuring 1.3 cm. Common bile duct: Diameter: 2.0 mm Liver: No focal lesion identified. Within normal limits in parenchymal echogenicity. IMPRESSION: Interval development of numerous gallstones. Gallbladder wall is now upper normal  measuring 3 mm. Although no sonographic Percell Miller sign is identified, the interval changed indicates some degree of cholestasis. Electronically Signed   By: Nolon Nations M.D.   On: 11/23/2016 21:58    Review of Systems  Constitutional: Negative for chills and fever.  HENT: Negative for congestion.   Respiratory: Negative for cough, shortness of breath and stridor.   Cardiovascular: Negative for chest pain.  Gastrointestinal: Positive for abdominal pain and nausea. Negative for vomiting.  Genitourinary: Negative for dysuria.  Neurological: Negative for weakness.  All other systems reviewed and are negative.   Blood pressure 110/82, pulse 91, temperature 98.8 F (37.1 C), temperature source Oral, resp. rate 18, height '5\' 2"'  (1.575 m), weight 68 kg (150 lb), last menstrual period 10/22/2016, SpO2 100 %, unknown if currently breastfeeding. Physical Exam  Constitutional: She is oriented to person, place, and time. She appears well-developed and well-nourished. No distress.  HENT:  Head: Normocephalic and atraumatic.  Right Ear: External ear normal.  Left Ear: External ear normal.  Nose: Nose normal.  Mouth/Throat: Oropharynx is clear and moist. No oropharyngeal exudate.  Eyes: Conjunctivae are normal. Pupils are equal, round, and reactive to light. No scleral icterus.  Neck: Normal range of motion. No tracheal deviation present.  Cardiovascular: Normal rate, regular rhythm, normal heart sounds and intact distal pulses.   No murmur heard. Respiratory: Effort normal and breath sounds normal. No respiratory distress. She has no wheezes.  GI: She exhibits no distension. There is tenderness. There is no rebound.  There is mild tenderness with some guarding in the right upper quadrant  Musculoskeletal: Normal range of motion. She exhibits no edema or tenderness.  Lymphadenopathy:    She has no cervical adenopathy.  Neurological: She is alert and oriented to person, place, and time.  Skin:  Skin is warm and dry. No rash noted. She is not diaphoretic. No erythema.  Psychiatric: Her behavior is normal.     Assessment/Plan Symptomatic cholelithiasis with possible early cholecystitis.  I discussed the diagnosis with the patient and her family. Given this is her second event several weeks and given her tenderness, I recommended admission to the hospital for IV antibiotics and eventual cholecystectomy in the next 24-48 hours depending on or availability. I explained the reasonings for this in detail. They understand and agreed to proceed with the admission  [facility] A, MD 11/23/2016, 10:54 PM

## 2016-11-23 NOTE — ED Provider Notes (Signed)
[facility]PT Provider Note   CSN: [id number] Arrival date & time: 11/23/16  1903     History   Chief Complaint Chief Complaint  Patient presents with  . Abdominal Pain    HPI Abigail Lowe is a 26 y.o. female.  HPI 26 year old female with past medical history of known gallstones, confirmed with ultrasound on January 18, who presents with epigastric abdominal pain. The patient states that at approximately 6 PM today, several hours after eating, she developed an aching, gnawing, right upper quadrant pain that radiated to her back. She had mild nausea but no vomiting. Pain feels similar to her previous episodes. She has been trying to watch what she eats but states this pain has become increasingly frequent. Denies any fevers. Denies any alleviating or aggravating factors.  Past Medical History:  Diagnosis Date  . Medical history non-contributory   . No pertinent past medical history     Patient Active Problem List   Diagnosis Date Noted  . Symptomatic cholelithiasis 11/23/2016    Past Surgical History:  Procedure Laterality Date  . INTRAUTERINE DEVICE (IUD) INSERTION    . NO PAST SURGERIES      OB History    Gravida Para Term Preterm AB Living   2         1   SAB TAB Ectopic Multiple Live Births                   Home Medications    Prior to Admission medications   Medication Sig Start Date End Date Taking? Authorizing Provider  acetaminophen (TYLENOL) 325 MG tablet Take 325 mg by mouth every 6 (six) hours as needed for headache.   Yes Historical Provider, MD  omeprazole (PRILOSEC) 20 MG capsule Take 1 capsule (20 mg total) by mouth 2 (two) times daily before a meal. 11/06/16  Yes Fayrene HelperBowie Tran, [name]  ranitidine (ZANTAC) 150 MG capsule Take 1 capsule (150 mg total) by mouth 2 (two) times daily. 11/06/16  Yes Fayrene HelperBowie Tran, [name]    Family History Family History  Problem Relation Age of Onset  . Hypertension Father   . Heart disease Maternal Grandfather      Social History Social History  Substance Use Topics  . Smoking status: Never Smoker  . Smokeless tobacco: Never Used  . Alcohol use No     Allergies   Patient has no known allergies.   Review of Systems Review of Systems  Constitutional: Positive for fatigue. Negative for chills and fever.  HENT: Negative for congestion and rhinorrhea.   Eyes: Negative for visual disturbance.  Respiratory: Negative for cough, shortness of breath and wheezing.   Cardiovascular: Negative for chest pain and leg swelling.  Gastrointestinal: Positive for abdominal pain and nausea. Negative for diarrhea and vomiting.  Genitourinary: Negative for dysuria and flank pain.  Musculoskeletal: Negative for neck pain and neck stiffness.  Skin: Negative for rash and wound.  Allergic/Immunologic: Negative for immunocompromised state.  Neurological: Negative for syncope, weakness and headaches.  All other systems reviewed and are negative.    Physical Exam Updated Vital Signs BP 113/61 (BP Location: Left Arm)   Pulse 81   Temp 97.9 F (36.6 C) (Oral)   Resp 17   Ht 5\' 2"  (1.575 m)   Wt 156 lb 11.2 oz (71.1 kg)   LMP 10/22/2016   SpO2 100%   BMI 28.66 kg/m   Physical Exam  Constitutional: She is oriented to person, place, and time. She appears  well-developed and well-nourished. No distress.  HENT:  Head: Normocephalic and atraumatic.  Eyes: Conjunctivae are normal.  Neck: Neck supple.  Cardiovascular: Normal rate, regular rhythm and normal heart sounds.  Exam reveals no friction rub.   No murmur heard. Pulmonary/Chest: Effort normal and breath sounds normal. No respiratory distress. She has no wheezes. She has no rales.  Abdominal: Soft. She exhibits no distension. There is tenderness (Moderate, without overt Murphy sign). There is no rebound and no guarding.  Musculoskeletal: She exhibits no edema.  Neurological: She is alert and oriented to person, place, and time. She exhibits normal  muscle tone.  Skin: Skin is warm. Capillary refill takes less than 2 seconds.  Psychiatric: She has a normal mood and affect.  Nursing note and vitals reviewed.    ED Treatments / Results  Labs (all labs ordered are listed, but only abnormal results are displayed) Labs Reviewed  COMPREHENSIVE METABOLIC PANEL - Abnormal; Notable for the following:       Result Value   Glucose, Bld 129 (*)    AST 51 (*)    All other components within normal limits  CBC - Abnormal; Notable for the following:    MCH 25.9 (*)    All other components within normal limits  URINALYSIS, ROUTINE W REFLEX MICROSCOPIC - Abnormal; Notable for the following:    Ketones, ur 5 (*)    Leukocytes, UA TRACE (*)    Squamous Epithelial / LPF 0-5 (*)    All other components within normal limits  LIPASE, BLOOD  CBC  COMPREHENSIVE METABOLIC PANEL    EKG  EKG Interpretation None       Radiology US Abdomen Limited  Result Date: 11/23/2016 CLINICAL DATA:  Upper abdominal pain which started tonight. EXAM: US ABDOMEN LIMITED - RIGHT UPPER QUADRANT COMPARISON:  11/06/2016 FINDINGS: Gallbladder: Gallbladder wall is upper limits normal in thickness, 3 mm. No sonographic Murphy sign. Sludge is present. Numerous gallstones are present, largest measuring 1.3 cm. Common bile duct: Diameter: 2.0 mm Liver: No focal lesion identified. Within normal limits in parenchymal echogenicity. IMPRESSION: Interval development of numerous gallstones. Gallbladder wall is now upper normal measuring 3 mm. Although no sonographic Eulah Pont sign is identified, the interval changed indicates some degree of cholestasis. Electronically Signed   By: Norva Pavlov M.D.   On: 11/23/2016 21:58    Procedures Procedures (including critical care time)  Medications Ordered in ED Medications  enoxaparin (LOVENOX) injection 40 mg (not administered)  0.9 % NaCl with KCl 20 mEq/ L  infusion (not administered)  cefTRIAXone (ROCEPHIN) 2 g in dextrose 5 %  50 mL IVPB (not administered)  HYDROmorphone (DILAUDID) injection 1 mg (not administered)  ondansetron (ZOFRAN-ODT) disintegrating tablet 4 mg (not administered)    Or  ondansetron (ZOFRAN) injection 4 mg (not administered)  sodium chloride 0.9 % bolus 1,000 mL (1,000 mLs Intravenous New Bag/Given 11/23/16 2225)  ondansetron (ZOFRAN) injection 4 mg (4 mg Intravenous Given 11/23/16 2225)  HYDROmorphone (DILAUDID) injection 0.5 mg (0.5 mg Intravenous Given 11/23/16 2225)     Initial Impression / Assessment and Plan / ED Course  I have reviewed the triage vital signs and the nursing notes.  Pertinent labs & imaging results that were available during my care of the patient were reviewed by me and considered in my medical decision making (see chart for details).     26 year old female here with right upper quadrant pain. Suspect symptomatic biliary colic versus cholecystitis. Labwork shows mild AST elevation concerning for biliary  stasis. Ultrasound subsequently obtained and shows increasing sludge as well as thickening gallbladder wall. Will consult surgery for evaluation.  D/w Dr. Magnus Ivan of surgery. Will admit. IVF given.  Final Clinical Impressions(s) / ED Diagnoses   Final diagnoses:  Symptomatic cholelithiasis    New Prescriptions Current Discharge Medication List       Shaune Pollack, MD 11/24/16 [phone]

## 2016-11-24 LAB — COMPREHENSIVE METABOLIC PANEL
ALT: 81 U/L — ABNORMAL HIGH (ref 14–54)
AST: 87 U/L — ABNORMAL HIGH (ref 15–41)
Albumin: 3.1 g/dL — ABNORMAL LOW (ref 3.5–5.0)
Alkaline Phosphatase: 80 U/L (ref 38–126)
Anion gap: 4 — ABNORMAL LOW (ref 5–15)
BUN: 5 mg/dL — ABNORMAL LOW (ref 6–20)
CO2: 25 mmol/L (ref 22–32)
Calcium: 8.4 mg/dL — ABNORMAL LOW (ref 8.9–10.3)
Chloride: 109 mmol/L (ref 101–111)
Creatinine, Ser: 0.7 mg/dL (ref 0.44–1.00)
GFR calc Af Amer: >60 mL/min (ref >60)
GFR calc non Af Amer: >60 mL/min (ref >60)
Glucose, Bld: 106 mg/dL — ABNORMAL HIGH (ref 65–99)
Potassium: 4.4 mmol/L (ref 3.5–5.1)
Sodium: 138 mmol/L (ref 135–145)
Total Bilirubin: 0.5 mg/dL (ref 0.3–1.2)
Total Protein: 6.2 g/dL — ABNORMAL LOW (ref 6.5–8.1)

## 2016-11-24 LAB — CBC
HCT: 35.5 % — ABNORMAL LOW (ref 36.0–46.0)
Hemoglobin: 11.4 g/dL — ABNORMAL LOW (ref 12.0–15.0)
MCH: 26 pg (ref 26.0–34.0)
MCHC: 32.1 g/dL (ref 30.0–36.0)
MCV: 81.1 fL (ref 78.0–100.0)
Platelets: 260 10*3/uL (ref 150–400)
RBC: 4.38 MIL/uL (ref 3.87–5.11)
RDW: 13.6 % (ref 11.5–15.5)
WBC: 6.1 10*3/uL (ref 4.0–10.5)

## 2016-11-24 LAB — GLUCOSE, CAPILLARY: Glucose-Capillary: 98 mg/dL (ref 65–99)

## 2016-11-24 MED ORDER — ONDANSETRON 4 MG PO TBDP: 4.0000 mg | ORAL_TABLET | Freq: Four times a day (QID) | ORAL | Status: DC | PRN

## 2016-11-24 MED ORDER — DEXTROSE 5 % IV SOLN
2.0000 g | Freq: Every day | INTRAVENOUS | Status: DC
  Administered 2016-11-24 – 2016-11-25 (×4): 2 g via INTRAVENOUS
  Filled 2016-11-24 (×4): qty 2

## 2016-11-24 MED ORDER — ONDANSETRON HCL 4 MG/2ML IJ SOLN: 4.0000 mg | Freq: Four times a day (QID) | INTRAMUSCULAR | Status: DC | PRN

## 2016-11-24 MED ORDER — POTASSIUM CHLORIDE IN NACL 20-0.9 MEQ/L-% IV SOLN
INTRAVENOUS | Status: DC
  Administered 2016-11-24 – 2016-11-25 (×3): via INTRAVENOUS
  Filled 2016-11-24 (×3): qty 1000

## 2016-11-24 MED ORDER — ENOXAPARIN SODIUM 40 MG/0.4ML ~~LOC~~ SOLN
40.0000 mg | Freq: Every day | SUBCUTANEOUS | Status: DC
Start: 2016-11-24 — End: 2016-11-26
  Administered 2016-11-24: 40 mg via SUBCUTANEOUS
  Filled 2016-11-24 (×2): qty 0.4

## 2016-11-24 MED ORDER — HYDROMORPHONE HCL 2 MG/ML IJ SOLN
1.0000 mg | INTRAMUSCULAR | Status: DC | PRN
  Administered 2016-11-25 (×2): 1 mg via INTRAVENOUS
  Filled 2016-11-24 (×2): qty 1

## 2016-11-24 NOTE — Progress Notes (Signed)
[facility] Progress Note     Subjective: No events overnight. Pt currently denies abdominal pain. I discussed with the pt that her surgery will likely be tomorrow.   Objective: Vital signs in last 24 hours: Temp:  [97.9 F (36.6 C)-98.8 F (37.1 C)] 97.9 F (36.6 C) (02/05 0011) Pulse Rate:  [78-99] 81 (02/05 0011) Resp:  [17-18] 17 (02/05 0011) BP: (104-118)/(61-82) 113/61 (02/05 0011) SpO2:  [98 %-100 %] 100 % (02/05 0011) Weight:  [150 lb (68 kg)-156 lb 11.2 oz (71.1 kg)] 156 lb 11.2 oz (71.1 kg) (02/05 0011)    Intake/Output from previous day: No intake/output data recorded. Intake/Output this shift: No intake/output data recorded.  PE: Gen:  Alert, NAD, pleasant, cooperative, lying in bed, well appearing Card:  RRR, no M/G/R heard Pulm:  Rate and effort normal Abd: Soft, nondistended, nontender, +BS  Skin: no rashes noted, not diaphoretic, warm and dry  Lab Results:   Recent Labs  11/23/16 1942 11/24/16 0721  WBC 5.7 6.1  HGB 13.0 11.4*  HCT 40.2 35.5*  PLT 294 260   BMET  Recent Labs  11/23/16 1942  NA 136  K 3.9  CL 103  CO2 23  GLUCOSE 129*  BUN 10  CREATININE 0.77  CALCIUM 9.4   PT/INR No results for input(s): LABPROT, INR in the last 72 hours. CMP     Component Value Date/Time   NA 136 11/23/2016 1942   NA 140 01/04/2012 2159   K 3.9 11/23/2016 1942   K 3.7 01/04/2012 2159   CL 103 11/23/2016 1942   CL 103 01/04/2012 2159   CO2 23 11/23/2016 1942   CO2 27 01/04/2012 2159   GLUCOSE 129 (H) 11/23/2016 1942   GLUCOSE 108 (H) 01/04/2012 2159   BUN 10 11/23/2016 1942   BUN 8 01/04/2012 2159   CREATININE 0.77 11/23/2016 1942   CREATININE 0.54 (L) 01/04/2012 2159   CALCIUM 9.4 11/23/2016 1942   CALCIUM 8.7 01/04/2012 2159   PROT 8.0 11/23/2016 1942   PROT 8.0 01/04/2012 2159   ALBUMIN 4.2 11/23/2016 1942   ALBUMIN 3.8 01/04/2012 2159   AST 51 (H) 11/23/2016 1942   AST 29 01/04/2012 2159   ALT 29 11/23/2016 1942   ALT 26 01/04/2012 2159   ALKPHOS 97 11/23/2016 1942   ALKPHOS 76 01/04/2012 2159   BILITOT 0.8 11/23/2016 1942   BILITOT 0.2 01/04/2012 2159   GFRNONAA >60 11/23/2016 1942   GFRNONAA >60 01/04/2012 2159   GFRAA >60 11/23/2016 1942   GFRAA >60 01/04/2012 2159   Lipase     Component Value Date/Time   LIPASE 44 11/23/2016 1942   LIPASE 146 01/04/2012 2159       Studies/Results: US Abdomen Limited  Result Date: 11/23/2016 CLINICAL DATA:  Upper abdominal pain which started tonight. EXAM: US ABDOMEN LIMITED - RIGHT UPPER QUADRANT COMPARISON:  11/06/2016 FINDINGS: Gallbladder: Gallbladder wall is upper limits normal in thickness, 3 mm. No sonographic Murphy sign. Sludge is present. Numerous gallstones are present, largest measuring 1.3 cm. Common bile duct: Diameter: 2.0 mm Liver: No focal lesion identified. Within normal limits in parenchymal echogenicity. IMPRESSION: Interval development of numerous gallstones. Gallbladder wall is now upper normal measuring 3 mm. Although no sonographic Eulah Pont sign is identified, the interval changed indicates some degree of cholestasis. Electronically Signed   By: Norva Pavlov M.D.   On: 11/23/2016 21:58    Anti-infectives: Anti-infectives    Start     Dose/Rate Route Frequency Ordered Stop  11/24/16 0200  cefTRIAXone (ROCEPHIN) 2 g in dextrose 5 % 50 mL IVPB     2 g 100 mL/hr over 30 Minutes Intravenous Daily at bedtime 11/24/16 0032         Assessment/Plan  Symptomatic cholelithiasis with possible early cholecystitis. - OR likely tomorrow for lap chole - currently not having any pain  FEN: clears, NPO midnight  VTE: lovenox, SCD's ID: Rocephin 2/5>>  Plan: Clears today then NPO at midnight. OR tomorrow for lap chole.   LOS: 1 day    Jerre SimonJessica L Giabella Duhart , [facility] 11/24/2016, 8:06 AM Pager: [phone] Consults: [phone] Mon-Fri 7:00 am-4:30 pm Sat-Sun 7:00 am-11:30 am

## 2016-11-25 ENCOUNTER — Inpatient Hospital Stay (HOSPITAL_COMMUNITY): Payer: Medicaid Other

## 2016-11-25 ENCOUNTER — Inpatient Hospital Stay (HOSPITAL_COMMUNITY): Payer: Medicaid Other | Admitting: Anesthesiology

## 2016-11-25 ENCOUNTER — Encounter (HOSPITAL_COMMUNITY): Payer: Self-pay | Admitting: Anesthesiology

## 2016-11-25 ENCOUNTER — Encounter (HOSPITAL_COMMUNITY): Admission: EM | Disposition: A | Discharge status: Home / Self Care | Payer: Self-pay | Source: Home / Self Care

## 2016-11-25 HISTORY — PX: CHOLECYSTECTOMY: SHX55

## 2016-11-25 LAB — SURGICAL PCR SCREEN
MRSA, PCR: NEGATIVE
Staphylococcus aureus: NEGATIVE

## 2016-11-25 SURGERY — LAPAROSCOPIC CHOLECYSTECTOMY WITH INTRAOPERATIVE CHOLANGIOGRAM
Anesthesia: General | Site: Abdomen

## 2016-11-25 MED ORDER — BUPIVACAINE HCL 0.25 % IJ SOLN
INTRAMUSCULAR | Status: DC | PRN
  Administered 2016-11-25: 10 mL

## 2016-11-25 MED ORDER — ONDANSETRON HCL 4 MG/2ML IJ SOLN
INTRAMUSCULAR | Status: DC | PRN
Start: 2016-11-25 — End: 2016-11-25
  Administered 2016-11-25: 4 mg via INTRAVENOUS

## 2016-11-25 MED ORDER — PROMETHAZINE HCL 25 MG/ML IJ SOLN: 6.2500 mg | INTRAMUSCULAR | Status: DC | PRN

## 2016-11-25 MED ORDER — SODIUM CHLORIDE 0.9 % IR SOLN
Status: DC | PRN
  Administered 2016-11-25: 1000 mL

## 2016-11-25 MED ORDER — BUPIVACAINE HCL (PF) 0.25 % IJ SOLN
INTRAMUSCULAR | Status: AC
  Filled 2016-11-25: qty 30

## 2016-11-25 MED ORDER — MORPHINE SULFATE (PF) 2 MG/ML IV SOLN: 2.0000 mg | INTRAVENOUS | Status: DC | PRN

## 2016-11-25 MED ORDER — SUGAMMADEX SODIUM 200 MG/2ML IV SOLN
INTRAVENOUS | Status: DC | PRN
  Administered 2016-11-25: 200 mg via INTRAVENOUS

## 2016-11-25 MED ORDER — HYDROMORPHONE HCL 1 MG/ML IJ SOLN
INTRAMUSCULAR | Status: AC
  Filled 2016-11-25: qty 0.5

## 2016-11-25 MED ORDER — PROPOFOL 10 MG/ML IV BOLUS
INTRAVENOUS | Status: AC
  Filled 2016-11-25: qty 20

## 2016-11-25 MED ORDER — LACTATED RINGERS IV SOLN: INTRAVENOUS | Status: DC

## 2016-11-25 MED ORDER — DEXAMETHASONE SODIUM PHOSPHATE 10 MG/ML IJ SOLN
INTRAMUSCULAR | Status: DC | PRN
  Administered 2016-11-25: 10 mg via INTRAVENOUS

## 2016-11-25 MED ORDER — FENTANYL CITRATE (PF) 100 MCG/2ML IJ SOLN
INTRAMUSCULAR | Status: AC
  Filled 2016-11-25: qty 4

## 2016-11-25 MED ORDER — LACTATED RINGERS IV SOLN
INTRAVENOUS | Status: DC
  Administered 2016-11-25 (×2): via INTRAVENOUS

## 2016-11-25 MED ORDER — OXYCODONE-ACETAMINOPHEN 5-325 MG PO TABS
1.0000 | ORAL_TABLET | ORAL | Status: DC | PRN
  Administered 2016-11-26: 1 via ORAL
  Filled 2016-11-25: qty 1

## 2016-11-25 MED ORDER — LIDOCAINE HCL (CARDIAC) 20 MG/ML IV SOLN
INTRAVENOUS | Status: DC | PRN
  Administered 2016-11-25: 100 mg via INTRAVENOUS

## 2016-11-25 MED ORDER — POTASSIUM CHLORIDE IN NACL 20-0.9 MEQ/L-% IV SOLN
INTRAVENOUS | Status: DC
  Administered 2016-11-25: 17:00:00 via INTRAVENOUS
  Filled 2016-11-25: qty 1000

## 2016-11-25 MED ORDER — HYDROMORPHONE HCL 1 MG/ML IJ SOLN
0.2500 mg | INTRAMUSCULAR | Status: DC | PRN
  Administered 2016-11-25: 0.5 mg via INTRAVENOUS

## 2016-11-25 MED ORDER — FENTANYL CITRATE (PF) 100 MCG/2ML IJ SOLN
INTRAMUSCULAR | Status: DC | PRN
  Administered 2016-11-25 (×4): 50 ug via INTRAVENOUS
  Administered 2016-11-25: 100 ug via INTRAVENOUS

## 2016-11-25 MED ORDER — IOPAMIDOL (ISOVUE-300) INJECTION 61%
INTRAVENOUS | Status: AC
  Filled 2016-11-25: qty 50

## 2016-11-25 MED ORDER — FENTANYL CITRATE (PF) 100 MCG/2ML IJ SOLN
INTRAMUSCULAR | Status: AC
  Filled 2016-11-25: qty 2

## 2016-11-25 MED ORDER — MEPERIDINE HCL 25 MG/ML IJ SOLN: 6.2500 mg | INTRAMUSCULAR | Status: DC | PRN

## 2016-11-25 MED ORDER — ROCURONIUM BROMIDE 100 MG/10ML IV SOLN
INTRAVENOUS | Status: DC | PRN
  Administered 2016-11-25: 50 mg via INTRAVENOUS

## 2016-11-25 MED ORDER — 0.9 % SODIUM CHLORIDE (POUR BTL) OPTIME
TOPICAL | Status: DC | PRN
  Administered 2016-11-25: 1000 mL

## 2016-11-25 MED ORDER — SODIUM CHLORIDE 0.9 % IV SOLN
INTRAVENOUS | Status: DC | PRN
  Administered 2016-11-25: 4 mL

## 2016-11-25 MED ORDER — PROPOFOL 10 MG/ML IV BOLUS
INTRAVENOUS | Status: DC | PRN
  Administered 2016-11-25: 200 mg via INTRAVENOUS

## 2016-11-25 MED ORDER — MIDAZOLAM HCL 2 MG/2ML IJ SOLN
INTRAMUSCULAR | Status: AC
  Filled 2016-11-25: qty 2

## 2016-11-25 MED ORDER — MIDAZOLAM HCL 5 MG/5ML IJ SOLN
INTRAMUSCULAR | Status: DC | PRN
  Administered 2016-11-25: 2 mg via INTRAVENOUS

## 2016-11-25 SURGICAL SUPPLY — 41 items
APPLIER CLIP ROT 10 11.4 M/L (STAPLE) ×3
BENZOIN TINCTURE PRP APPL 2/3 (GAUZE/BANDAGES/DRESSINGS) ×3 IMPLANT
BLADE SURG ROTATE 9660 (MISCELLANEOUS) IMPLANT
CANISTER SUCTION 2500CC (MISCELLANEOUS) ×3 IMPLANT
CHLORAPREP W/TINT 26ML (MISCELLANEOUS) ×3 IMPLANT
CLIP APPLIE ROT 10 11.4 M/L (STAPLE) ×1 IMPLANT
CLOSURE WOUND 1/2 X4 (GAUZE/BANDAGES/DRESSINGS) ×1
COVER MAYO STAND STRL (DRAPES) ×3 IMPLANT
COVER SURGICAL LIGHT HANDLE (MISCELLANEOUS) ×3 IMPLANT
DRAPE C-ARM 42X72 X-RAY (DRAPES) ×3 IMPLANT
DRSG TEGADERM 2-3/8X2-3/4 SM (GAUZE/BANDAGES/DRESSINGS) ×9 IMPLANT
DRSG TEGADERM 4X4.75 (GAUZE/BANDAGES/DRESSINGS) ×3 IMPLANT
ELECT REM PT RETURN 9FT ADLT (ELECTROSURGICAL) ×3
ELECTRODE REM PT RTRN 9FT ADLT (ELECTROSURGICAL) ×1 IMPLANT
FILTER SMOKE EVAC LAPAROSHD (FILTER) ×3 IMPLANT
GAUZE SPONGE 2X2 8PLY STRL LF (GAUZE/BANDAGES/DRESSINGS) ×1 IMPLANT
GLOVE BIO SURGEON STRL SZ7 (GLOVE) ×3 IMPLANT
GLOVE BIOGEL PI IND STRL 7.5 (GLOVE) ×1 IMPLANT
GLOVE BIOGEL PI INDICATOR 7.5 (GLOVE) ×2
GOWN STRL REUS W/ TWL LRG LVL3 (GOWN DISPOSABLE) ×3 IMPLANT
GOWN STRL REUS W/TWL LRG LVL3 (GOWN DISPOSABLE) ×6
KIT BASIN OR (CUSTOM PROCEDURE TRAY) ×3 IMPLANT
KIT ROOM TURNOVER OR (KITS) ×3 IMPLANT
NS IRRIG 1000ML POUR BTL (IV SOLUTION) ×3 IMPLANT
PAD ARMBOARD 7.5X6 YLW CONV (MISCELLANEOUS) ×3 IMPLANT
POUCH SPECIMEN RETRIEVAL 10MM (ENDOMECHANICALS) ×3 IMPLANT
SCISSORS LAP 5X35 DISP (ENDOMECHANICALS) ×3 IMPLANT
SET CHOLANGIOGRAPH 5 50 .035 (SET/KITS/TRAYS/PACK) ×3 IMPLANT
SET IRRIG TUBING LAPAROSCOPIC (IRRIGATION / IRRIGATOR) ×3 IMPLANT
SLEEVE ENDOPATH XCEL 5M (ENDOMECHANICALS) ×3 IMPLANT
SPECIMEN JAR SMALL (MISCELLANEOUS) ×3 IMPLANT
SPONGE GAUZE 2X2 STER 10/PKG (GAUZE/BANDAGES/DRESSINGS) ×2
STRIP CLOSURE SKIN 1/2X4 (GAUZE/BANDAGES/DRESSINGS) ×2 IMPLANT
SUT MNCRL AB 4-0 PS2 18 (SUTURE) ×3 IMPLANT
TOWEL OR 17X24 6PK STRL BLUE (TOWEL DISPOSABLE) ×3 IMPLANT
TOWEL OR 17X26 10 PK STRL BLUE (TOWEL DISPOSABLE) ×3 IMPLANT
TRAY LAPAROSCOPIC MC (CUSTOM PROCEDURE TRAY) ×3 IMPLANT
TROCAR XCEL BLUNT TIP 100MML (ENDOMECHANICALS) ×3 IMPLANT
TROCAR XCEL NON-BLD 11X100MML (ENDOMECHANICALS) ×3 IMPLANT
TROCAR XCEL NON-BLD 5MMX100MML (ENDOMECHANICALS) ×3 IMPLANT
TUBING INSUFFLATION (TUBING) ×3 IMPLANT

## 2016-11-25 NOTE — Op Note (Signed)
Laparoscopic Cholecystectomy with IOC Procedure Note  Indications: This patient presents with symptomatic gallbladder disease and will undergo laparoscopic cholecystectomy.  Ultrasound showed multiple gallstones with a mildly thickened GB wall.  Pre-operative Diagnosis: Calculus of gallbladder with other cholecystitis, without mention of obstruction  Post-operative Diagnosis: Same  Surgeon: Antolin Belsito K.   Assistants: none  Anesthesia: General endotracheal anesthesia  ASA Class: 1  Procedure Details  The patient was seen again in the Holding Room. The risks, benefits, complications, treatment options, and expected outcomes were discussed with the patient. The possibilities of reaction to medication, pulmonary aspiration, perforation of viscus, bleeding, recurrent infection, finding a normal gallbladder, the need for additional procedures, failure to diagnose a condition, the possible need to convert to an open procedure, and creating a complication requiring transfusion or operation were discussed with the patient. The likelihood of improving the patient's symptoms with return to their baseline status is good.  The patient and/or family concurred with the proposed plan, giving informed consent. The site of surgery properly noted. The patient was taken to Operating Room, identified as Brennan BaileyBrandi M Brach and the procedure verified as Laparoscopic Cholecystectomy with Intraoperative Cholangiogram. A Time Out was held and the above information confirmed.  Prior to the induction of general anesthesia, antibiotic prophylaxis was administered. General endotracheal anesthesia was then administered and tolerated well. After the induction, the abdomen was prepped with Chloraprep and draped in the sterile fashion. The patient was positioned in the supine position.  Local anesthetic agent was injected into the skin near the umbilicus and an incision made. We dissected down to the abdominal fascia with blunt  dissection.  The fascia was incised vertically and we entered the peritoneal cavity bluntly.  A pursestring suture of 0-Vicryl was placed around the fascial opening.  The Hasson cannula was inserted and secured with the stay suture.  Pneumoperitoneum was then created with CO2 and tolerated well without any adverse changes in the patient's vital signs. An 11-mm port was placed in the subxiphoid position.  Two 5-mm ports were placed in the right upper quadrant. All skin incisions were infiltrated with a local anesthetic agent before making the incision and placing the trocars.   We positioned the patient in reverse Trendelenburg, tilted slightly to the patient's left.  The gallbladder was identified, the fundus grasped and retracted cephalad. Adhesions were lysed bluntly and with the electrocautery where indicated, taking care not to injure any adjacent organs or viscus. The infundibulum was grasped and retracted laterally, exposing the peritoneum overlying the triangle of Calot. This was then divided and exposed in a blunt fashion. A critical view of the cystic duct and cystic artery was obtained.  The cystic duct was clearly identified and bluntly dissected circumferentially. The cystic duct was ligated with a clip distally.   An incision was made in the cystic duct and the [facility] introduced. The catheter was secured using a clip. A cholangiogram was then obtained which showed good visualization of the distal and proximal biliary tree with no sign of filling defects or obstruction.  Contrast flowed easily into the duodenum. The catheter was then removed.   The cystic duct was then ligated with clips and divided. The cystic artery was identified, dissected free, ligated with clips and divided as well.   The gallbladder was dissected from the liver bed in retrograde fashion with the electrocautery. The gallbladder was removed and placed in an Endocatch sac. The liver bed was irrigated and  inspected. Hemostasis was achieved with the  electrocautery. Copious irrigation was utilized and was repeatedly aspirated until clear.  The gallbladder and Endocatch sac were then removed through the umbilical port site.  The pursestring suture was used to close the umbilical fascia.    We again inspected the right upper quadrant for hemostasis.  Pneumoperitoneum was released as we removed the trocars.  4-0 Monocryl was used to close the skin.   Benzoin, steri-strips, and clean dressings were applied. The patient was then extubated and brought to the recovery room in stable condition. Instrument, sponge, and needle counts were correct at closure and at the conclusion of the case.   Findings: Cholecystitis with Cholelithiasis  Estimated Blood Loss: Minimal         Drains: none         Specimens: Gallbladder           Complications: None; patient tolerated the procedure well.         Disposition: PACU - hemodynamically stable.         Condition: stable  Wilmon Arms. Corliss Skains, MD, [facility]  General/ Trauma Surgery  11/25/2016 12:56 PM

## 2016-11-25 NOTE — Progress Notes (Signed)
[facility] Progress Note     Subjective: No fever, chills, cough, vomiting overnight. Pt has some nausea. No abdominal pain. Pt is complaining of a sore throat on the left side. Pain only with swallowing. No difficultly swallowing. Pt also complaining of sinus congestion and post nasal drainage.  Objective: Vital signs in last 24 hours: Temp:  [98.3 F (36.8 C)-98.7 F (37.1 C)] 98.3 F (36.8 C) (02/06 0443) Pulse Rate:  [67-84] 67 (02/06 0443) Resp:  [17] 17 (02/06 0443) BP: (96-105)/(53-63) 96/53 (02/06 0443) SpO2:  [100 %] 100 % (02/06 0443) Last BM Date: 11/21/16  Intake/Output from previous day: 02/05 0701 - 02/06 0700 In: 3272 [P.O.:558; I.V.:2714] Out: 2550 [Urine:2550] Intake/Output this shift: No intake/output data recorded.  PE: Gen:  Alert, NAD, pleasant, cooperative, lying in bed, well appearing HEENT: left tonsil with mild edema and erythema, no exudate noted. No uvula deviation, or swelling. Right tonsil with mild erythema no edema. No lymphadenopathy noted in the neck.  Card:  RRR, no M/G/R heard Pulm:  Rate and effort normal Abd: Soft, nondistended, nontender, +BS  Skin: no rashes noted, not diaphoretic, warm and dry  Lab Results:   Recent Labs  11/23/16 1942 11/24/16 0721  WBC 5.7 6.1  HGB 13.0 11.4*  HCT 40.2 35.5*  PLT 294 260   BMET  Recent Labs  11/23/16 1942 11/24/16 0721  NA 136 138  K 3.9 4.4  CL 103 109  CO2 23 25  GLUCOSE 129* 106*  BUN 10 5*  CREATININE 0.77 0.70  CALCIUM 9.4 8.4*   PT/INR No results for input(s): LABPROT, INR in the last 72 hours. CMP     Component Value Date/Time   NA 138 11/24/2016 0721   NA 140 01/04/2012 2159   K 4.4 11/24/2016 0721   K 3.7 01/04/2012 2159   CL 109 11/24/2016 0721   CL 103 01/04/2012 2159   CO2 25 11/24/2016 0721   CO2 27 01/04/2012 2159   GLUCOSE 106 (H) 11/24/2016 0721   GLUCOSE 108 (H) 01/04/2012 2159   BUN 5 (L) 11/24/2016 0721   BUN 8 01/04/2012 2159   CREATININE 0.70 11/24/2016 0721   CREATININE 0.54 (L) 01/04/2012 2159   CALCIUM 8.4 (L) 11/24/2016 0721   CALCIUM 8.7 01/04/2012 2159   PROT 6.2 (L) 11/24/2016 0721   PROT 8.0 01/04/2012 2159   ALBUMIN 3.1 (L) 11/24/2016 0721   ALBUMIN 3.8 01/04/2012 2159   AST 87 (H) 11/24/2016 0721   AST 29 01/04/2012 2159   ALT 81 (H) 11/24/2016 0721   ALT 26 01/04/2012 2159   ALKPHOS 80 11/24/2016 0721   ALKPHOS 76 01/04/2012 2159   BILITOT 0.5 11/24/2016 0721   BILITOT 0.2 01/04/2012 2159   GFRNONAA >60 11/24/2016 0721   GFRNONAA >60 01/04/2012 2159   GFRAA >60 11/24/2016 0721   GFRAA >60 01/04/2012 2159   Lipase     Component Value Date/Time   LIPASE 44 11/23/2016 1942   LIPASE 146 01/04/2012 2159       Studies/Results: [place] Abdomen Limited  Result Date: 11/23/2016 CLINICAL DATA:  Upper abdominal pain which started tonight. EXAM: US ABDOMEN LIMITED - RIGHT UPPER QUADRANT COMPARISON:  11/06/2016 FINDINGS: Gallbladder: Gallbladder wall is upper limits normal in thickness, 3 mm. No sonographic Murphy sign. Sludge is present. Numerous gallstones are present, largest measuring 1.3 cm. Common bile duct: Diameter: 2.0 mm Liver: No focal lesion identified. Within normal limits in parenchymal echogenicity. IMPRESSION: Interval development of numerous gallstones. Gallbladder wall  is now upper normal measuring 3 mm. Although no sonographic Eulah Pont sign is identified, the interval changed indicates some degree of cholestasis. Electronically Signed   By: Norva Pavlov M.D.   On: 11/23/2016 21:58    Anti-infectives: Anti-infectives    Start     Dose/Rate Route Frequency Ordered Stop   11/24/16 0200  cefTRIAXone (ROCEPHIN) 2 g in dextrose 5 % 50 mL IVPB     2 g 100 mL/hr over 30 Minutes Intravenous Daily at bedtime 11/24/16 0032         Assessment/Plan Sore throat - likely due to PND, mild edema, no difficulty swallowing. will monitor  Symptomatic cholelithiasis with possible early  cholecystitis. - OR today for lap chole - currently not having any pain only mild nausea  FEN: NPO  VTE: lovenox, SCD's ID: Rocephin 2/5>>  Plan: OR today for lap chole.   LOS: 2 days    Jerre Simon , [facility] 11/25/2016, 8:14 AM Pager: [phone] Consults: [phone] Mon-Fri 7:00 am-4:30 pm Sat-Sun 7:00 am-11:30 am

## 2016-11-25 NOTE — Transfer of Care (Signed)
Immediate Anesthesia Transfer of Care Note  Patient: Abigail ParodyBrandi M Bufkin  Procedure(s) Performed: Procedure(s): LAPAROSCOPIC CHOLECYSTECTOMY WITH INTRAOPERATIVE CHOLANGIOGRAM (N/A)  Patient Location: PACU  Anesthesia Type:General  Level of Consciousness: awake, alert , oriented and patient cooperative  Airway & Oxygen Therapy: Patient Spontanous Breathing and Patient connected to nasal cannula oxygen  Post-op Assessment: Report given to RN and Post -op Vital signs reviewed and stable  Post vital signs: Reviewed and stable  Last Vitals:  Vitals:   11/25/16 0443 11/25/16 1014  BP: (!) 96/53 112/73  Pulse: 67 91  Resp: 17 18  Temp: 36.8 C 36.8 C    Last Pain:  Vitals:   11/25/16 1014  TempSrc: Oral  PainSc:          Complications: No apparent anesthesia complications

## 2016-11-25 NOTE — Anesthesia Preprocedure Evaluation (Addendum)
Anesthesia Evaluation  Patient identified by MRN, date of birth, ID band Patient awake    Reviewed: Allergy & Precautions, NPO status , Patient's Chart, lab work & pertinent test results  History of Anesthesia Complications Negative for: history of anesthetic complications  Airway Mallampati: II  TM Distance: >3 FB Neck ROM: Full    Dental no notable dental hx. (+) Dental Advisory Given   Pulmonary neg pulmonary ROS,    Pulmonary exam normal        Cardiovascular negative cardio ROS Normal cardiovascular exam     Neuro/Psych negative neurological ROS  negative psych ROS   GI/Hepatic Neg liver ROS,   Endo/Other  negative endocrine ROS  Renal/GU negative Renal ROS  negative genitourinary   Musculoskeletal negative musculoskeletal ROS (+)   Abdominal   Peds negative pediatric ROS (+)  Hematology negative hematology ROS (+)   Anesthesia Other Findings   Reproductive/Obstetrics negative OB ROS                            Anesthesia Physical Anesthesia Plan  ASA: II  Anesthesia Plan: General   Post-op Pain Management:    Induction: Intravenous  Airway Management Planned: Oral ETT  Additional Equipment:   Intra-op Plan:   Post-operative Plan: Extubation in OR  Informed Consent: I have reviewed the patients History and Physical, chart, labs and discussed the procedure including the risks, benefits and alternatives for the proposed anesthesia with the patient or authorized representative who has indicated his/her understanding and acceptance.   Dental advisory given  Plan Discussed with: CRNA, Anesthesiologist and Surgeon  Anesthesia Plan Comments:        Anesthesia Quick Evaluation

## 2016-11-26 ENCOUNTER — Encounter (HOSPITAL_COMMUNITY): Payer: Self-pay | Admitting: Surgery

## 2016-11-26 LAB — CBC
HCT: 37.1 % (ref 36.0–46.0)
Hemoglobin: 12.1 g/dL (ref 12.0–15.0)
MCH: 26.7 pg (ref 26.0–34.0)
MCHC: 32.6 g/dL (ref 30.0–36.0)
MCV: 81.7 fL (ref 78.0–100.0)
Platelets: 276 10*3/uL (ref 150–400)
RBC: 4.54 MIL/uL (ref 3.87–5.11)
RDW: 13.7 % (ref 11.5–15.5)
WBC: 9.9 10*3/uL (ref 4.0–10.5)

## 2016-11-26 LAB — COMPREHENSIVE METABOLIC PANEL
ALT: 60 U/L — ABNORMAL HIGH (ref 14–54)
AST: 47 U/L — ABNORMAL HIGH (ref 15–41)
Albumin: 3.3 g/dL — ABNORMAL LOW (ref 3.5–5.0)
Alkaline Phosphatase: 73 U/L (ref 38–126)
Anion gap: 12 (ref 5–15)
BUN: 6 mg/dL (ref 6–20)
CO2: 22 mmol/L (ref 22–32)
Calcium: 8.9 mg/dL (ref 8.9–10.3)
Chloride: 102 mmol/L (ref 101–111)
Creatinine, Ser: 0.6 mg/dL (ref 0.44–1.00)
GFR calc Af Amer: >60 mL/min (ref >60)
GFR calc non Af Amer: >60 mL/min (ref >60)
Glucose, Bld: 115 mg/dL — ABNORMAL HIGH (ref 65–99)
Potassium: 4 mmol/L (ref 3.5–5.1)
Sodium: 136 mmol/L (ref 135–145)
Total Bilirubin: 0.4 mg/dL (ref 0.3–1.2)
Total Protein: 6.4 g/dL — ABNORMAL LOW (ref 6.5–8.1)

## 2016-11-26 MED ORDER — OXYCODONE-ACETAMINOPHEN 5-325 MG PO TABS: 1.0000 | ORAL_TABLET | ORAL | 0 refills | Status: DC | PRN

## 2016-11-26 MED ORDER — OXYCODONE-ACETAMINOPHEN 5-325 MG PO TABS
1.0000 | ORAL_TABLET | ORAL | Status: DC | PRN
  Administered 2016-11-26: 2 via ORAL
  Filled 2016-11-26: qty 2

## 2016-11-26 NOTE — Anesthesia Postprocedure Evaluation (Signed)
Anesthesia Post Note  Patient: Abigail Lowe  Procedure(s) Performed: Procedure(s) (LRB): LAPAROSCOPIC CHOLECYSTECTOMY WITH INTRAOPERATIVE CHOLANGIOGRAM (N/A)  Patient location during evaluation: PACU Anesthesia Type: General Level of consciousness: awake and alert and patient cooperative Pain management: pain level controlled Vital Signs Assessment: post-procedure vital signs reviewed and stable Respiratory status: spontaneous breathing and respiratory function stable Cardiovascular status: stable Anesthetic complications: no       Last Vitals:  Vitals:   11/26/16 0215 11/26/16 0520  BP: (!) 94/52 (!) 104/56  Pulse: 65 63  Resp: 17 18  Temp: 36.7 C 36.8 C    Last Pain:  Vitals:   11/26/16 0547  TempSrc:   PainSc: 6                  Danay Mckellar S

## 2016-11-26 NOTE — Discharge Instructions (Signed)
Please arrive at least 30 min before your appointment to complete your check in paperwork.  If you are unable to arrive 30 min prior to your appointment time we may have to cancel or reschedule you.  LAPAROSCOPIC SURGERY: POST OP INSTRUCTIONS  1. DIET: Follow a light bland diet the first 24 hours after arrival home, such as soup, liquids, crackers, etc. Be sure to include lots of fluids daily. Avoid fast food or heavy meals as your are more likely to get nauseated. Eat a low fat the next few days after surgery.  2. Take your usually prescribed home medications unless otherwise directed. 3. PAIN CONTROL:  1. Pain is best controlled by a usual combination of three different methods TOGETHER:  1. Ice/Heat 2. Over the counter pain medication 3. Prescription pain medication 2. Most patients will experience some swelling and bruising around the incisions. Ice packs or heating pads (30-60 minutes up to 6 times a day) will help. Use ice for the first few days to help decrease swelling and bruising, then switch to heat to help relax tight/sore spots and speed recovery. Some people prefer to use ice alone, heat alone, alternating between ice & heat. Experiment to what works for you. Swelling and bruising can take several weeks to resolve.  3. It is helpful to take an over-the-counter pain medication regularly for the first few weeks. Choose one of the following that works best for you:  1. Naproxen (Aleve, etc) Two 220mg  tabs twice a day 2. Ibuprofen (Advil, etc) Three 200mg  tabs four times a day (every meal & bedtime) 3. Acetaminophen (Tylenol, etc) 500-650mg  four times a day (every meal & bedtime) 4. A prescription for pain medication (such as oxycodone, hydrocodone, etc) should be given to you upon discharge. Take your pain medication as prescribed.  1. If you are having problems/concerns with the prescription medicine (does not control pain, nausea, vomiting, rash, itching, etc), please call us [phone]  [phone] to see if we need to switch you to a different pain medicine that will work better for you and/or control your side effect better. 2. If you need a refill on your pain medication, please contact your pharmacy. They will contact our office to request authorization. Prescriptions will not be filled after 5 pm or on week-ends. 4. Avoid getting constipated. Between the surgery and the pain medications, it is common to experience some constipation. Increasing fluid intake and taking a fiber supplement (such as Metamucil, Citrucel, FiberCon, MiraLax, etc) 1-2 times a day regularly and a stool softener daily will usually help prevent this problem from occurring. A mild laxative (prune juice, Milk of Magnesia, MiraLax, etc) should be taken according to package directions if there are no bowel movements after 48 hours.  5. Watch out for diarrhea. If you have many loose bowel movements, simplify your diet to bland foods & liquids for a few days. Stop any stool softeners and decrease your fiber supplement. Switching to mild anti-diarrheal medications (Kayopectate, Pepto Bismol) can help. If this worsens or does not improve, please call us. 6. Wash / shower every day. You may shower over the dressings as they are waterproof. Continue to shower over incision(s) after the dressing is off. 7. Remove your waterproof bandages 5 days after surgery. You may leave the incision open to air. You may replace a dressing/Band-Aid to cover the incision for comfort if you wish.  8. ACTIVITIES as tolerated:  1. You may resume regular (light) daily activities beginning the next day--such as  daily self-care, walking, climbing stairs--gradually increasing activities as tolerated. If you can walk 30 minutes without difficulty, it is safe to try more intense activity such as jogging, treadmill, bicycling, low-impact aerobics, swimming, etc. 2. Save the most intensive and strenuous activity for last such as sit-ups, heavy lifting,  contact sports, etc Refrain from any heavy lifting or straining until you are off narcotics for pain control.  3. DO NOT PUSH THROUGH PAIN. Let pain be your guide: If it hurts to do something, don't do it. Pain is your body warning you to avoid that activity for another week until the pain goes down. 4. You may drive when you are no longer taking prescription pain medication, you can comfortably wear a seatbelt, and you can safely maneuver your car and apply brakes. 5. You may have sexual intercourse when it is comfortable.  6. Avoid lifting >20lbs for 4 weeks  9. FOLLOW UP in our office  1. Please call [facility] at [phone] to set up an appointment to see your surgeon in the office for a follow-up appointment approximately 2-3 weeks after your surgery. 2. Make sure that you call for this appointment the day you arrive home to insure a convenient appointment time.      10. IF YOU HAVE DISABILITY OR FAMILY LEAVE FORMS, BRING THEM TO THE               OFFICE FOR PROCESSING.   WHEN TO CALL US [phone]:  1. Poor pain control 2. Reactions / problems with new medications (rash/itching, nausea, etc)  3. Fever over 101.5 F (38.5 C) 4. Inability to urinate 5. Nausea and/or vomiting 6. Worsening swelling or bruising 7. Continued bleeding from incision. 8. Increased pain, redness, or drainage from the incision  The clinic staff is available to answer your questions during regular business hours (8:30am-5pm). Please dont hesitate to call and ask to speak to one of our nurses for clinical concerns.  If you have a medical emergency, go to the nearest emergency room or call 911.  A surgeon from [facility] is always on call at the [facility]   [facility] [place]  [address], [address], [place], [place] [address] ?  MAIN: [phone] ? TOLL FREE: [phone] ?  FAX [phone]  [email or web address]

## 2016-11-26 NOTE — Discharge Summary (Signed)
[facility] Discharge Summary   Patient ID: Abigail BaileyBrandi M Lowe MRN: [id number] DOB/AGE: [birth year] 26 y.o.  Admit date: 11/23/2016 Discharge date: 11/26/2016  Admitting Diagnosis: Symptomatic cholelithiasis  Discharge Diagnosis Patient Active Problem List   Diagnosis Date Noted  . Symptomatic cholelithiasis 11/23/2016  Cholecystitis  Consultants None  Imaging: Dg Cholangiogram Operative  Result Date: 11/25/2016 CLINICAL DATA:  Intraoperative cholangiogram during laparoscopic cholecystectomy. EXAM: INTRAOPERATIVE CHOLANGIOGRAM FLUOROSCOPY TIME:  7 seconds COMPARISON:  Right upper quadrant abdominal ultrasound - 11/23/2016 FINDINGS: Intraoperative cholangiographic images of the right upper abdominal quadrant during laparoscopic cholecystectomy are provided for review. Surgical clips overlie the expected location of the gallbladder fossa. Contrast injection demonstrates selective cannulation of the central aspect of the cystic duct. There is passage of contrast through the central aspect of the cystic duct with filling of a non dilated common bile duct. There is passage of contrast though the CBD and into the descending portion of the duodenum. There is minimal reflux of injected contrast into the common hepatic duct and central aspect of the non dilated intrahepatic biliary system. There are no discrete filling defects within the opacified portions of the biliary system to suggest the presence of choledocholithiasis. IMPRESSION: No evidence of choledocholithiasis. Electronically Signed   By: Simonne ComeJohn  Watts M.D.   On: 11/25/2016 15:14    Procedures Dr. Corliss Skainssuei (11/25/16) - [facility]  Hospital Course:  Abigail Lowe is a 26 year old female [facility] of right upper quadrant abdominal pain and nausea.  Workup showed cholelithiasis.  Patient was admitted and underwent procedure listed above.  Tolerated procedure well and was transferred to the  floor.  Diet was advanced as tolerated.  On POD#1, the patient was voiding well, tolerating diet, ambulating well, pain well controlled, vital signs stable, incisions c/d/i and felt stable for discharge home.  Patient will follow up in our office in 2 weeks and knows to call with questions or concerns.  She will call to confirm appointment date/time.  Patient was discharged in good condition.  Physical Exam: General:  Alert, NAD, pleasant, cooperative, well appearing, lying in bed Cardio: RRR, S1 & S2 normal, no murmur, rubs, gallops Resp: Effort normal, lungs CTA bilaterally, no wheezes, rales, rhonchi Abd:  Soft, ND, mild generalized tenderness, incisions C/D/I  Allergies as of 11/26/2016   No Known Allergies     Medication List    STOP taking these medications   acetaminophen 325 MG tablet Commonly known as:  TYLENOL   ranitidine 150 MG capsule Commonly known as:  ZANTAC     TAKE these medications   omeprazole 20 MG capsule Commonly known as:  PRILOSEC Take 1 capsule (20 mg total) by mouth 2 (two) times daily before a meal.   oxyCODONE-acetaminophen 5-325 MG tablet Commonly known as:  PERCOCET/ROXICET Take 1 tablet by mouth every 4 (four) hours as needed for moderate pain.        Follow-up Information    [facility] [place]. Schedule an appointment as soon as possible for a visit in 2 week(s).   Specialty:  General Surgery Why:  for follow up Contact information: [address] [place] [id number] [phone]          Signed: Joyce CopaJessica L [facility] 11/26/2016, 10:54 AM Pager: [phone] Consults: [phone] Mon-Fri 7:00 am-4:30 pm Sat-Sun 7:00 am-11:30 am

## 2017-11-26 DIAGNOSIS — H52222 Regular astigmatism, left eye: Secondary | ICD-10-CM | POA: Diagnosis not present

## 2017-11-26 DIAGNOSIS — H5213 Myopia, bilateral: Secondary | ICD-10-CM | POA: Diagnosis not present

## 2017-12-15 ENCOUNTER — Other Ambulatory Visit: Payer: Self-pay

## 2017-12-15 ENCOUNTER — Encounter: Payer: Self-pay | Admitting: Emergency Medicine

## 2017-12-15 ENCOUNTER — Ambulatory Visit: Payer: 59 | Admitting: Emergency Medicine

## 2017-12-15 VITALS — BP 112/80 | HR 112 | Temp 98.5°F | Resp 16 | Ht 62.25 in | Wt 162.8 lb

## 2017-12-15 DIAGNOSIS — J020 Streptococcal pharyngitis: Secondary | ICD-10-CM | POA: Insufficient documentation

## 2017-12-15 DIAGNOSIS — J029 Acute pharyngitis, unspecified: Secondary | ICD-10-CM | POA: Insufficient documentation

## 2017-12-15 HISTORY — DX: Streptococcal pharyngitis: J02.0

## 2017-12-15 LAB — POCT RAPID STREP A (OFFICE): Rapid Strep A Screen: POSITIVE — AB

## 2017-12-15 MED ORDER — AZITHROMYCIN 250 MG PO TABS
ORAL_TABLET | ORAL | 0 refills | Status: DC
Start: 2017-12-15 — End: 2018-01-16

## 2017-12-15 NOTE — Progress Notes (Signed)
Abigail Lowe 27 y.o.   Chief Complaint  Patient presents with  . Rash    on neck - per patient she noticed it this morning  . Sore Throat    x 2 days    HISTORY OF PRESENT ILLNESS: This is a 27 y.o. female complaining of sore throat for 2 days.  Also noted a faint rash to the front lower neck area.   Sore Throat   This is a new problem. The current episode started in the past 7 days. The problem has been gradually worsening. There has been no fever. The pain is moderate. Associated symptoms include congestion, swollen glands and trouble swallowing. Pertinent negatives include no abdominal pain, coughing, ear pain, headaches, neck pain, shortness of breath or vomiting. She has tried nothing for the symptoms.     Prior to Admission medications   Medication Sig Start Date End Date Taking? Authorizing Provider  omeprazole (PRILOSEC) 20 MG capsule Take 1 capsule (20 mg total) by mouth 2 (two) times daily before a meal. Patient not taking: Reported on 12/15/2017 11/06/16   Fayrene Helper, [name]  oxyCODONE-acetaminophen (PERCOCET/ROXICET) 5-325 MG tablet Take 1 tablet by mouth every 4 (four) hours as needed for moderate pain. Patient not taking: Reported on 12/15/2017 11/26/16   Jerre Simon, PA    No Known Allergies  Patient Active Problem List   Diagnosis Date Noted  . Symptomatic cholelithiasis 11/23/2016    Past Medical History:  Diagnosis Date  . Anxiety   . Depression   . Medical history non-contributory   . No pertinent past medical history     Past Surgical History:  Procedure Laterality Date  . CHOLECYSTECTOMY N/A 11/25/2016   Procedure: LAPAROSCOPIC CHOLECYSTECTOMY WITH INTRAOPERATIVE CHOLANGIOGRAM;  Surgeon: Manus Rudd, MD;  Location: [facility] OR;  Service: General;  Laterality: N/A;  . INTRAUTERINE DEVICE (IUD) INSERTION    . NO PAST SURGERIES      Social History   Socioeconomic History  . Marital status: Married    Spouse name: Not on file  . Number of children:  Not on file  . Years of education: Not on file  . Highest education level: Not on file  Social Needs  . Financial resource strain: Not on file  . Food insecurity - worry: Not on file  . Food insecurity - inability: Not on file  . Transportation needs - medical: Not on file  . Transportation needs - non-medical: Not on file  Occupational History  . Not on file  Tobacco Use  . Smoking status: Never Smoker  . Smokeless tobacco: Never Used  Substance and Sexual Activity  . Alcohol use: No  . Drug use: No  . Sexual activity: Yes    Birth control/protection: IUD  Other Topics Concern  . Not on file  Social History Narrative  . Not on file    Family History  Problem Relation Age of Onset  . Hypertension Father   . Hyperlipidemia Father   . Heart disease Maternal Grandfather   . Cancer Maternal Grandfather   . ADD / ADHD Brother   . Hypertension Maternal Grandmother   . Cancer Paternal Grandfather   . Diabetes Paternal Grandfather   . Hyperlipidemia Paternal Grandfather      Review of Systems  Constitutional: Negative.  Negative for chills, fever and malaise/fatigue.  HENT: Positive for congestion, sore throat and trouble swallowing. Negative for ear pain, nosebleeds and sinus pain.   Eyes: Negative.  Negative for discharge and redness.  Respiratory: Negative for cough, hemoptysis and shortness of breath.   Cardiovascular: Negative for chest pain, palpitations and leg swelling.  Gastrointestinal: Negative.  Negative for abdominal pain, nausea and vomiting.  Genitourinary: Negative.   Musculoskeletal: Negative for neck pain.  Skin: Positive for rash (Neck area).  Neurological: Negative for headaches.  All other systems reviewed and are negative.   Vitals:   12/15/17 1126  BP: 112/80  Pulse: (!) 112  Resp: 16  Temp: 98.5 F (36.9 C)  SpO2: 96%    Physical Exam  Constitutional: She is oriented to person, place, and time. She appears well-developed and  well-nourished.  HENT:  Head: Normocephalic and atraumatic.  Right Ear: External ear normal.  Left Ear: External ear normal.  Mouth/Throat: Uvula is midline and mucous membranes are normal. No uvula swelling. Oropharyngeal exudate, posterior oropharyngeal edema and posterior oropharyngeal erythema present. No tonsillar abscesses.  Eyes: Conjunctivae and EOM are normal. Pupils are equal, round, and reactive to light.  Neck: Normal range of motion. Neck supple. No JVD present. No thyromegaly present.  Cardiovascular: Normal rate, regular rhythm and normal heart sounds.  Pulmonary/Chest: Effort normal and breath sounds normal.  Abdominal: Soft. She exhibits no distension. There is no tenderness.  Musculoskeletal: Normal range of motion.  Lymphadenopathy:    She has no cervical adenopathy.  Neurological: She is alert and oriented to person, place, and time. No sensory deficit. She exhibits normal muscle tone.  Skin: Skin is warm and dry. Capillary refill takes less than 2 seconds. No rash noted.  Psychiatric: She has a normal mood and affect. Her behavior is normal.  Vitals reviewed.   Results for orders placed or performed in visit on 12/15/17 (from the past 24 hour(s))  POCT rapid strep A     Status: Abnormal   Collection Time: 12/15/17 12:10 PM  Result Value Ref Range   Rapid Strep A Screen Positive (A) Negative    ASSESSMENT & PLAN: Abigail Lowe was seen today for rash and sore throat.  Diagnoses and all orders for this visit:  Sore throat -     Cancel: Culture, Group A Strep -     POCT rapid strep A  Strep pharyngitis -     Cancel: Culture, Group A Strep -     POCT rapid strep A -     azithromycin (ZITHROMAX) 250 MG tablet; Sig as indicated    Patient Instructions   Strep Throat Strep throat is an infection of the throat. It is caused by germs. Strep throat spreads from person to person because of coughing, sneezing, or close contact. Follow these instructions at  home: Medicines  Take over-the-counter and prescription medicines only as told by your doctor.  Take your antibiotic medicine as told by your doctor. Do not stop taking the medicine even if you feel better.  Have family members who also have a sore throat or fever go to a doctor. Eating and drinking  Do not share food, drinking cups, or personal items.  Try eating soft foods until your sore throat feels better.  Drink enough fluid to keep your pee (urine) clear or pale yellow. General instructions  Rinse your mouth (gargle) with a salt-water mixture 3-4 times per day or as needed. To make a salt-water mixture, stir -1 tsp of salt into 1 cup of warm water.  Make sure that all people in your house wash their hands well.  Rest.  Stay home from school or work until you have been taking antibiotics  for 24 hours.  Keep all follow-up visits as told by your doctor. This is important. Contact a doctor if:  Your neck keeps getting bigger.  You get a rash, cough, or earache.  You cough up thick liquid that is green, yellow-brown, or bloody.  You have pain that does not get better with medicine.  Your problems get worse instead of getting better.  You have a fever. Get help right away if:  You throw up (vomit).  You get a very bad headache.  You neck hurts or it feels stiff.  You have chest pain or you are short of breath.  You have drooling, very bad throat pain, or changes in your voice.  Your neck is swollen or the skin gets red and tender.  Your mouth is dry or you are peeing less than normal.  You keep feeling more tired or it is hard to wake up.  Your joints are red or they hurt. This information is not intended to replace advice given to you by your health care provider. Make sure you discuss any questions you have with your health care provider. Document Released: 03/24/2008 Document Revised: 06/04/2016 Document Reviewed: 01/29/2015 Elsevier Interactive  Patient Education  2018 [employer]     IF you received an x-ray today, you will receive an invoice from [facility]. Please contact [facility] at [phone] with questions or concerns regarding your invoice.   IF you received labwork today, you will receive an invoice from [facility]. Please contact [facility] at [phone] with questions or concerns regarding your invoice.   Our billing staff will not be able to assist you with questions regarding bills from these companies.  You will be contacted with the lab results as soon as they are available. The fastest way to get your results is to activate your My Chart account. Instructions are located on the last page of this paperwork. If you have not heard from us regarding the results in 2 weeks, please contact this office.     Strep Throat Strep throat is an infection of the throat. It is caused by germs. Strep throat spreads from person to person because of coughing, sneezing, or close contact. Follow these instructions at home: Medicines  Take over-the-counter and prescription medicines only as told by your doctor.  Take your antibiotic medicine as told by your doctor. Do not stop taking the medicine even if you feel better.  Have family members who also have a sore throat or fever go to a doctor. Eating and drinking  Do not share food, drinking cups, or personal items.  Try eating soft foods until your sore throat feels better.  Drink enough fluid to keep your pee (urine) clear or pale yellow. General instructions  Rinse your mouth (gargle) with a salt-water mixture 3-4 times per day or as needed. To make a salt-water mixture, stir -1 tsp of salt into 1 cup of warm water.  Make sure that all people in your house wash their hands well.  Rest.  Stay home from school or work until you have been taking antibiotics for 24 hours.  Keep all follow-up visits as told by your doctor. This is  important. Contact a doctor if:  Your neck keeps getting bigger.  You get a rash, cough, or earache.  You cough up thick liquid that is green, yellow-brown, or bloody.  You have pain that does not get better with medicine.  Your problems get worse instead of getting better.  You  have a fever. Get help right away if:  You throw up (vomit).  You get a very bad headache.  You neck hurts or it feels stiff.  You have chest pain or you are short of breath.  You have drooling, very bad throat pain, or changes in your voice.  Your neck is swollen or the skin gets red and tender.  Your mouth is dry or you are peeing less than normal.  You keep feeling more tired or it is hard to wake up.  Your joints are red or they hurt. This information is not intended to replace advice given to you by your health care provider. Make sure you discuss any questions you have with your health care provider. Document Released: 03/24/2008 Document Revised: 06/04/2016 Document Reviewed: 01/29/2015 Elsevier Interactive Patient Education  2018 [employer].      Edwina Barth, MD [facility]

## 2017-12-15 NOTE — Patient Instructions (Addendum)
Strep Throat Strep throat is an infection of the throat. It is caused by germs. Strep throat spreads from person to person because of coughing, sneezing, or close contact. Follow these instructions at home: Medicines  Take over-the-counter and prescription medicines only as told by your doctor.  Take your antibiotic medicine as told by your doctor. Do not stop taking the medicine even if you feel better.  Have family members who also have a sore throat or fever go to a doctor. Eating and drinking  Do not share food, drinking cups, or personal items.  Try eating soft foods until your sore throat feels better.  Drink enough fluid to keep your pee (urine) clear or pale yellow. General instructions  Rinse your mouth (gargle) with a salt-water mixture 3-4 times per day or as needed. To make a salt-water mixture, stir -1 tsp of salt into 1 cup of warm water.  Make sure that all people in your house wash their hands well.  Rest.  Stay home from school or work until you have been taking antibiotics for 24 hours.  Keep all follow-up visits as told by your doctor. This is important. Contact a doctor if:  Your neck keeps getting bigger.  You get a rash, cough, or earache.  You cough up thick liquid that is green, yellow-brown, or bloody.  You have pain that does not get better with medicine.  Your problems get worse instead of getting better.  You have a fever. Get help right away if:  You throw up (vomit).  You get a very bad headache.  You neck hurts or it feels stiff.  You have chest pain or you are short of breath.  You have drooling, very bad throat pain, or changes in your voice.  Your neck is swollen or the skin gets red and tender.  Your mouth is dry or you are peeing less than normal.  You keep feeling more tired or it is hard to wake up.  Your joints are red or they hurt. This information is not intended to replace advice given to you by your health care  provider. Make sure you discuss any questions you have with your health care provider. Document Released: 03/24/2008 Document Revised: 06/04/2016 Document Reviewed: 01/29/2015 Elsevier Interactive Patient Education  2018 [employer]     IF you received an x-ray today, you will receive an invoice from [facility]. Please contact [facility] at [phone] with questions or concerns regarding your invoice.   IF you received labwork today, you will receive an invoice from [facility]. Please contact [facility] at [phone] with questions or concerns regarding your invoice.   Our billing staff will not be able to assist you with questions regarding bills from these companies.  You will be contacted with the lab results as soon as they are available. The fastest way to get your results is to activate your My Chart account. Instructions are located on the last page of this paperwork. If you have not heard from us regarding the results in 2 weeks, please contact this office.     Strep Throat Strep throat is an infection of the throat. It is caused by germs. Strep throat spreads from person to person because of coughing, sneezing, or close contact. Follow these instructions at home: Medicines  Take over-the-counter and prescription medicines only as told by your doctor.  Take your antibiotic medicine as told by your doctor. Do not stop taking the medicine even if you feel better.  Have family members who also have a sore throat or fever go to a doctor. Eating and drinking  Do not share food, drinking cups, or personal items.  Try eating soft foods until your sore throat feels better.  Drink enough fluid to keep your pee (urine) clear or pale yellow. General instructions  Rinse your mouth (gargle) with a salt-water mixture 3-4 times per day or as needed. To make a salt-water mixture, stir -1 tsp of salt into 1 cup of warm water.  Make sure that all people in your  house wash their hands well.  Rest.  Stay home from school or work until you have been taking antibiotics for 24 hours.  Keep all follow-up visits as told by your doctor. This is important. Contact a doctor if:  Your neck keeps getting bigger.  You get a rash, cough, or earache.  You cough up thick liquid that is green, yellow-brown, or bloody.  You have pain that does not get better with medicine.  Your problems get worse instead of getting better.  You have a fever. Get help right away if:  You throw up (vomit).  You get a very bad headache.  You neck hurts or it feels stiff.  You have chest pain or you are short of breath.  You have drooling, very bad throat pain, or changes in your voice.  Your neck is swollen or the skin gets red and tender.  Your mouth is dry or you are peeing less than normal.  You keep feeling more tired or it is hard to wake up.  Your joints are red or they hurt. This information is not intended to replace advice given to you by your health care provider. Make sure you discuss any questions you have with your health care provider. Document Released: 03/24/2008 Document Revised: 06/04/2016 Document Reviewed: 01/29/2015 Elsevier Interactive Patient Education  [employer].

## 2017-12-18 ENCOUNTER — Other Ambulatory Visit: Payer: Self-pay

## 2017-12-18 ENCOUNTER — Ambulatory Visit: Payer: 59 | Admitting: Physician Assistant

## 2017-12-18 ENCOUNTER — Encounter: Payer: Self-pay | Admitting: Physician Assistant

## 2017-12-18 VITALS — BP 118/70 | HR 82 | Temp 98.1°F | Resp 16 | Ht 62.25 in | Wt 161.0 lb

## 2017-12-18 DIAGNOSIS — F411 Generalized anxiety disorder: Secondary | ICD-10-CM

## 2017-12-18 DIAGNOSIS — F329 Major depressive disorder, single episode, unspecified: Secondary | ICD-10-CM | POA: Insufficient documentation

## 2017-12-18 DIAGNOSIS — Z8049 Family history of malignant neoplasm of other genital organs: Secondary | ICD-10-CM | POA: Diagnosis not present

## 2017-12-18 DIAGNOSIS — Z124 Encounter for screening for malignant neoplasm of cervix: Secondary | ICD-10-CM

## 2017-12-18 DIAGNOSIS — F32A Depression, unspecified: Secondary | ICD-10-CM | POA: Insufficient documentation

## 2017-12-18 DIAGNOSIS — F339 Major depressive disorder, recurrent, unspecified: Secondary | ICD-10-CM

## 2017-12-18 MED ORDER — SERTRALINE HCL 50 MG PO TABS: 50.0000 mg | ORAL_TABLET | Freq: Every day | ORAL | 3 refills | Status: DC

## 2017-12-18 NOTE — Progress Notes (Signed)
Subjective:    Patient ID: Abigail Lowe, female    DOB: 1991-05-28, 27 y.o.   MRN: [id number] Chief Complaint  Patient presents with  . Depression    most of life, over past year it's gotten worse and has anxiety as well    Depression         This is a chronic problem.  Episode onset: whole life. The problem is unchanged.  Associated symptoms include decreased concentration, fatigue, helplessness, hopelessness, irritable, appetite change, headaches and sad.  Associated symptoms include does not have insomnia.     The symptoms are aggravated by work stress, social issues and family issues.  Past treatments include SSRIs - Selective serotonin reuptake inhibitors (Zoloft worked in past.).  Patient has taken Zoloft in 2010 and it worked well for her, she felt like a "different person." In 2012, she stopped taking Zoloft when she became pregnant. Subsequently, she did not have health insurance and consequently had a care gap. Patient just got health insurance when she started working at [employer] in November 2018, so wanted to be re-evaluated.  Patient's anxiety manifests as a a feeling of heart beating out of her chest. She feels like she is able to handle her professional duties, but she does feel limited by her anxiety in professional social situations. Patient's anxiety is triggered heavily by crowds, and social situations.   Patient hates exercise and has not been able to exercise regularly due to her work schedule and her two young children (ages 66 and 41.) She is not up to date on her Pap smear but needs to be referred to a gynecologist as soon as possible to update it as her mother and maternal grandmother both had cervical cancer before 98.  Patient diagnosed with streptococcus pharyngitis on 12/15/2017. She no longer has throat pain. She has two pills left of her Azithromycin course.   Patient Active Problem List   Diagnosis Date Noted  . Depression 12/18/2017  . Generalized anxiety disorder  12/18/2017   No Known Allergies   Prior to Admission medications   Medication Sig Start Date End Date Taking? Authorizing Provider  azithromycin (ZITHROMAX) 250 MG tablet Sig as indicated 12/15/17  Yes Sagardia, Eilleen Kempf, MD    Past Medical History:  Diagnosis Date  . Anxiety   . Depression   . Gestational diabetes   . Medical history non-contributory   . No pertinent past medical history   . Sore throat 12/15/2017  . Strep pharyngitis 12/15/2017  . Symptomatic cholelithiasis 11/23/2016   Social History   Socioeconomic History  . Marital status: Married    Spouse name: [name]  . Number of children: 2  . Years of education: Not on file  . Highest education level: Not on file  Social Needs  . Financial resource strain: Not on file  . Food insecurity - worry: Not on file  . Food insecurity - inability: Not on file  . Transportation needs - medical: Not on file  . Transportation needs - non-medical: Not on file  Occupational History  . Not on file  Tobacco Use  . Smoking status: Never Smoker  . Smokeless tobacco: Never Used  Substance and Sexual Activity  . Alcohol use: No  . Drug use: No  . Sexual activity: Yes    Birth control/protection: Condom  Other Topics Concern  . Not on file  Social History Narrative   Live with husband [name] and 2 children in [place].     Family  History  Problem Relation Age of Onset  . Hypertension Father   . Hyperlipidemia Father   . Heart disease Maternal Grandfather   . Cancer Maternal Grandfather   . Cervical cancer Mother   . ADD / ADHD Brother   . Hypertension Maternal Grandmother   . Cervical cancer Maternal Grandmother   . Cancer Paternal Grandfather   . Diabetes Paternal Grandfather   . Hyperlipidemia Paternal Grandfather    Past Surgical History:  Procedure Laterality Date  . CHOLECYSTECTOMY N/A 11/25/2016   Procedure: LAPAROSCOPIC CHOLECYSTECTOMY WITH INTRAOPERATIVE CHOLANGIOGRAM;  Surgeon: Manus RuddMatthew Tsuei, MD;   Location: [facility] OR;  Service: General;  Laterality: N/A;  . INTRAUTERINE DEVICE (IUD) INSERTION    . NO PAST SURGERIES      Review of Systems  Constitutional: Positive for appetite change and fatigue. Negative for activity change and unexpected weight change.  Respiratory: Positive for shortness of breath. Negative for cough and chest tightness.   Cardiovascular: Positive for chest pain ("If I get really stressed out.") and palpitations. Negative for leg swelling.  Gastrointestinal: Positive for diarrhea. Negative for abdominal pain, constipation, nausea and vomiting.  Endocrine: Positive for heat intolerance. Negative for polydipsia and polyuria.  Neurological: Positive for headaches.  Psychiatric/Behavioral: Positive for decreased concentration, depression and dysphoric mood. Negative for sleep disturbance. The patient is nervous/anxious. The patient does not have insomnia.        Objective:   Physical Exam  Constitutional: She is oriented to person, place, and time. She appears well-developed and well-nourished. She is irritable.  BP 118/70   Pulse 82   Temp 98.1 F (36.7 C)   Resp 16   Ht 5' 2.25" (1.581 m)   Wt 161 lb (73 kg)   LMP 12/09/2017 (LMP Unknown)   SpO2 97%   BMI 29.21 kg/m   HENT:  Head: Normocephalic and atraumatic.  Right Ear: External ear normal.  Left Ear: External ear normal.  Nose: Nose normal.  Mouth/Throat: Oropharynx is clear and moist.  Eyes: Conjunctivae and EOM are normal. Pupils are equal, round, and reactive to light.  Neck: Normal range of motion. Neck supple.  Cardiovascular: Normal rate, regular rhythm, normal heart sounds and intact distal pulses.  Pulmonary/Chest: Effort normal and breath sounds normal.  Abdominal: Soft. Bowel sounds are normal. She exhibits no distension. There is no tenderness.  Musculoskeletal: Normal range of motion. She exhibits no tenderness or deformity.  Neurological: She is alert and oriented to person, place, and  time. She has normal reflexes.  Skin: Skin is warm and dry. No rash noted. No erythema. No pallor.  Psychiatric: She has a normal mood and affect. Her behavior is normal. Judgment and thought content normal.   GAD 7 : Generalized Anxiety Score 12/18/2017  Nervous, Anxious, on Edge 3  Control/stop worrying 3  Worry too much - different things 3  Trouble relaxing 3  Restless 3  Easily annoyed or irritable 2  Afraid - awful might happen 3  Total GAD 7 Score 20  Anxiety Difficulty Very difficult    Depression screen PHQ 2/9 12/18/2017  Decreased Interest 2  Down, Depressed, Hopeless 2  PHQ - 2 Score 4  Altered sleeping 3  Tired, decreased energy 3  Change in appetite 2  Feeling bad or failure about yourself  2  Trouble concentrating 1  Moving slowly or fidgety/restless 2  Suicidal thoughts 1  PHQ-9 Score 18  Difficult doing work/chores Somewhat difficult      Assessment & Plan:  1. Episode of recurrent major depressive disorder, unspecified depression episode severity (HCC) Patient has experienced depression for the majority of her life. It has worsened in the last month. Patient is now able to get care for her depression as she now has health insurance. Patient has used Zoloft in 2010 for depression and it worked well for her. Zoloft is prescribed and talk therapy recommended.  - sertraline (ZOLOFT) 50 MG tablet; Take 1 tablet (50 mg total) by mouth daily.  Dispense: 30 tablet; Refill: 3  2. Generalized anxiety disorder Patient's anxiety is limiting in her daily life, specifically in social settings. Zoloft is prescribed and talk therapy recommended.   - sertraline (ZOLOFT) 50 MG tablet; Take 1 tablet (50 mg total) by mouth daily.  Dispense: 30 tablet; Refill: 3  3. Screening for cervical cancer Patient is not up to date on her pap smear. She would rather go to a gynecologist to get it done. She has a family history of cervical cancer in her mother and maternal grandmother prior  to age 31. A referral   - Ambulatory referral to Obstetrics / Gynecology  4. Family history of cervical cancer Patient needs pap smear to be updated, but she would rather have it done at a gynecologist. Both her mother and maternal grandmother had cervical cancer prior to age 8. A referral to OB/GYN was made.  - Ambulatory referral to Obstetrics / Gynecology  Return in about 4 weeks (around 01/15/2018) for re-evalaution of mood, and to close care gaps.

## 2017-12-18 NOTE — Progress Notes (Signed)
Patient ID: Brennan BaileyBrandi M Coonrod, female    DOB: Mar 06, 1991, 27 y.o.   MRN: [id number]  PCP: System, Pcp Not In  Chief Complaint  Patient presents with  . Depression    most of life, over past year it's gotten worse and has anxiety as well     Subjective:   Presents for evaluation of depression  She describes depression "my whole life."  Associated with reduced concentration feelings of helplessness and hopelessness, fatigue, irritability, appetite changes, headaches.  Palpitations, no insomnia.  Symptoms are aggravated by stress at work, with friends and with family.  She is able to manage her emotions at work but she feels limited in social situations by her symptoms which are often triggered by even small crowds.  She was prescribed sertraline in 2010 and it worked well.  "I felt like a different person." Stopped in 2012 when she became pregnant.  After the birth of her child however she did not have health insurance and was unable to continue treatment.  She is working again and has health coverage.  Desires to resume treatment.  No regular exercise. Multiple care gaps. Recent visit here for sore throat.  Diagnosed with strep pharyngitis and is currently taking a azithromycin..    Review of Systems As above.  [facility] 12/18/2017 12/15/2017  Decreased Interest 2 0  Down, Depressed, Hopeless 2 0  PHQ - 2 Score 4 0  Altered sleeping 3 -  Tired, decreased energy 3 -  Change in appetite 2 -  Feeling bad or failure about yourself  2 -  Trouble concentrating 1 -  Moving slowly or fidgety/restless 2 -  Suicidal thoughts 1 -  PHQ-9 Score 18 -  Difficult doing work/chores Somewhat difficult -   GAD 7 : Generalized Anxiety Score 12/18/2017  Nervous, Anxious, on Edge 3  Control/stop worrying 3  Worry too much - different things 3  Trouble relaxing 3  Restless 3  Easily annoyed or irritable 2  Afraid - awful might happen 3  Total GAD 7 Score 20  Anxiety Difficulty  Very difficult      Patient Active Problem List   Diagnosis Date Noted  . Sore throat 12/15/2017  . Strep pharyngitis 12/15/2017  . Symptomatic cholelithiasis 11/23/2016     Prior to Admission medications   Medication Sig Start Date End Date Taking? Authorizing Provider  azithromycin (ZITHROMAX) 250 MG tablet Sig as indicated 12/15/17  Yes Sagardia, Eilleen KempfMiguel Jose, MD    No Known Allergies     Objective:  Physical Exam  Constitutional: She is oriented to person, place, and time. She appears well-developed and well-nourished. She is active and cooperative. No distress.  BP 118/70   Pulse 82   Temp 98.1 F (36.7 C)   Resp 16   Ht 5' 2.25" (1.581 m)   Wt 161 lb (73 kg)   LMP 12/09/2017 (LMP Unknown)   SpO2 97%   BMI 29.21 kg/m    Eyes: Conjunctivae are normal.  Pulmonary/Chest: Effort normal.  Neurological: She is alert and oriented to person, place, and time.  Psychiatric: She has a normal mood and affect. Her speech is normal and behavior is normal.           Assessment & Plan:   Problem List Items Addressed This Visit    Depression - Primary (Chronic)    Resume sertraline.  25 mg x 1 week then 50 mg daily.      Relevant Medications  sertraline (ZOLOFT) 50 MG tablet   Generalized anxiety disorder    Resume sertraline.  25 mg daily times 1 week then 50 mg daily      Relevant Medications   sertraline (ZOLOFT) 50 MG tablet    Other Visit Diagnoses    Screening for cervical cancer       Relevant Orders   Ambulatory referral to Obstetrics / Gynecology   Family history of cervical cancer       Relevant Orders   Ambulatory referral to Obstetrics / Gynecology       Return in about 4 weeks (around 01/15/2018) for re-evalaution of mood, and to close care gaps.   Fernande Bras, [name] Primary Care at [facility]

## 2017-12-18 NOTE — Patient Instructions (Addendum)
When starting the sertraline, take 1/2 tablet (25 mg) daily for the first week, then increase the to whole tablet.   For therapy -- [facility] ([address]Beth Coralie CommonKincaid, Ernest McCoy, Heather Belle[place]Kitchens) [place]- [phone] [facility] [address](Julie Whitt, Camelia Engerri CanbyBauert) - [phone] [facility] - [phone] [facility] - [phone] Buena IrishBob Mylan - [phone] Jeanella FlatterySarah Young - [facility], [phone] [facility]  - [phone] (do not file insurance) Paula ComptonKarla [phone] Doyne KeelKaren Elliot - [phone] Jeanella FlatterySarah Young - [facility], [phone] [facility] - [phone]    IF you received an x-ray today, you will receive an invoice from [facility]. Please contact [facility] at [phone] with questions or concerns regarding your invoice.   IF you received labwork today, you will receive an invoice from [facility]. Please contact [facility] at [phone] with questions or concerns regarding your invoice.   Our billing staff will not be able to assist you with questions regarding bills from these companies.  You will be contacted with the lab results as soon as they are available. The fastest way to get your results is to activate your My Chart account. Instructions are located on the last page of this paperwork. If you have not heard from us regarding the results in 2 weeks, please contact this office.

## 2017-12-20 NOTE — Assessment & Plan Note (Signed)
Resume sertraline.  25 mg x 1 week then 50 mg daily.

## 2017-12-20 NOTE — Assessment & Plan Note (Signed)
Resume sertraline.  25 mg daily times 1 week then 50 mg daily

## 2018-01-15 ENCOUNTER — Telehealth: Payer: Self-pay | Admitting: General Practice

## 2018-01-15 ENCOUNTER — Encounter: Payer: Self-pay | Admitting: Advanced Practice Midwife

## 2018-01-15 ENCOUNTER — Ambulatory Visit: Payer: 59 | Admitting: Physician Assistant

## 2018-01-15 NOTE — Telephone Encounter (Signed)
Patient called to reschedule appointment d/t job.  Patient wanted to just cancel the appointment and call back to reschedule.

## 2018-01-16 ENCOUNTER — Ambulatory Visit: Payer: 59 | Admitting: Physician Assistant

## 2018-01-16 ENCOUNTER — Other Ambulatory Visit: Payer: Self-pay | Admitting: Physician Assistant

## 2018-01-16 ENCOUNTER — Encounter: Payer: Self-pay | Admitting: Physician Assistant

## 2018-01-16 VITALS — BP 102/80 | HR 82 | Temp 98.5°F | Ht 62.5 in | Wt 160.0 lb

## 2018-01-16 DIAGNOSIS — F411 Generalized anxiety disorder: Secondary | ICD-10-CM | POA: Diagnosis not present

## 2018-01-16 DIAGNOSIS — F339 Major depressive disorder, recurrent, unspecified: Secondary | ICD-10-CM | POA: Diagnosis not present

## 2018-01-16 MED ORDER — BUPROPION HCL ER (XL) 150 MG PO TB24: 150.0000 mg | ORAL_TABLET | Freq: Every day | ORAL | 3 refills | Status: DC

## 2018-01-16 NOTE — Patient Instructions (Addendum)
ADD the bupropion (Wellbutrin) to the sertraline. Take the bupropion in the morning.    IF you received an x-ray today, you will receive an invoice from [facility]. Please contact [facility] at [phone] with questions or concerns regarding your invoice.   IF you received labwork today, you will receive an invoice from [facility]. Please contact [facility] at [phone] with questions or concerns regarding your invoice.   Our billing staff will not be able to assist you with questions regarding bills from these companies.  You will be contacted with the lab results as soon as they are available. The fastest way to get your results is to activate your My Chart account. Instructions are located on the last page of this paperwork. If you have not heard from us regarding the results in 2 weeks, please contact this office.

## 2018-01-16 NOTE — Assessment & Plan Note (Signed)
Inadequate improvement with sertraline. ADD bupropion. If tolerates and effective, could eliminate sertraline.  

## 2018-01-16 NOTE — Assessment & Plan Note (Signed)
Inadequate improvement with sertraline. ADD bupropion. If tolerates and effective, could eliminate sertraline.

## 2018-01-16 NOTE — Progress Notes (Signed)
Patient ID: Abigail Lowe, female    DOB: 03-15-1991, 27 y.o.   MRN: [id number]  PCP: System, Pcp Not In  Chief Complaint  Patient presents with  . Follow-up    depression  . Medication Refill    zoloft    Subjective:   Presents for evaluation of anxiety and depression.  Sertraline causes nausea, so she takes it at bedtime. Really tired all the time. Awakens still feeling exhausted. Seems worse with the addition of sertraline at her last visit. Sertraline selected because she has used it successfully in the past. Even using OTC energy shot drinks without benefit.   Review of Systems   Depression screen [facility] 2/9 01/16/2018 12/18/2017 12/15/2017  Decreased Interest 0 2 0  Down, Depressed, Hopeless 0 2 0  PHQ - 2 Score 0 4 0  Altered sleeping 3 3 -  Tired, decreased energy 3 3 -  Change in appetite 2 2 -  Feeling bad or failure about yourself  0 2 -  Trouble concentrating 0 1 -  Moving slowly or fidgety/restless 2 2 -  Suicidal thoughts 0 1 -  PHQ-9 Score 10 18 -  Difficult doing work/chores Extremely dIfficult Somewhat difficult -    GAD 7 : Generalized Anxiety Score 01/16/2018 12/18/2017  Nervous, Anxious, on Edge 3 3  Control/stop worrying 3 3  Worry too much - different things 3 3  Trouble relaxing 3 3  Restless 3 3  Easily annoyed or irritable 2 2  Afraid - awful might happen 1 3  Total GAD 7 Score 18 20  Anxiety Difficulty Very difficult Very difficult      Patient Active Problem List   Diagnosis Date Noted  . Depression 12/18/2017  . Generalized anxiety disorder 12/18/2017     Prior to Admission medications   Medication Sig Start Date End Date Taking? Authorizing Provider  sertraline (ZOLOFT) 50 MG tablet Take 1 tablet (50 mg total) by mouth daily. 12/18/17  Yes Dawnyel Leven, [name]     No Known Allergies     Objective:  Physical Exam  Constitutional: She is oriented to person, place, and time. She appears well-developed and well-nourished.  She is active and cooperative. No distress.  BP 102/80 (BP Location: Left Arm, Patient Position: Sitting, Cuff Size: Large)   Pulse 82   Temp 98.5 F (36.9 C) (Oral)   Ht 5' 2.5" (1.588 m)   Wt 160 lb (72.6 kg)   LMP 12/09/2017 (LMP Unknown) Comment: pt late & states usually late  SpO2 98%   BMI 28.80 kg/m    Eyes: Conjunctivae are normal.  Pulmonary/Chest: Effort normal.  Neurological: She is alert and oriented to person, place, and time.  Psychiatric: She has a normal mood and affect. Her speech is normal and behavior is normal.           Assessment & Plan:   Problem List Items Addressed This Visit    Depression - Primary (Chronic)    Inadequate improvement with sertraline. ADD bupropion. If tolerates and effective, could eliminate sertraline.      Relevant Medications   buPROPion (WELLBUTRIN XL) 150 MG 24 hr tablet   Generalized anxiety disorder    Inadequate improvement with sertraline. ADD bupropion. If tolerates and effective, could eliminate sertraline.       Relevant Medications   buPROPion (WELLBUTRIN XL) 150 MG 24 hr tablet       Return in about 1 month (around 02/13/2018) for re-evaluation of mood (  PHQ-9 and GAD 7).   Fernande Brashelle S. Quang Thorpe, [name] Primary Care at [facility]

## 2018-01-21 ENCOUNTER — Encounter: Payer: Self-pay | Admitting: Physician Assistant

## 2018-02-01 ENCOUNTER — Encounter: Payer: Self-pay | Admitting: Advanced Practice Midwife

## 2018-02-08 ENCOUNTER — Ambulatory Visit (INDEPENDENT_AMBULATORY_CARE_PROVIDER_SITE_OTHER): Payer: 59 | Admitting: Physician Assistant

## 2018-02-08 ENCOUNTER — Other Ambulatory Visit: Payer: Self-pay

## 2018-02-08 ENCOUNTER — Encounter: Payer: Self-pay | Admitting: Physician Assistant

## 2018-02-08 VITALS — BP 104/76 | HR 104 | Temp 98.1°F | Resp 16 | Ht 62.5 in | Wt 154.6 lb

## 2018-02-08 DIAGNOSIS — F411 Generalized anxiety disorder: Secondary | ICD-10-CM | POA: Diagnosis not present

## 2018-02-08 DIAGNOSIS — F339 Major depressive disorder, recurrent, unspecified: Secondary | ICD-10-CM | POA: Diagnosis not present

## 2018-02-08 DIAGNOSIS — Z13228 Encounter for screening for other metabolic disorders: Secondary | ICD-10-CM | POA: Diagnosis not present

## 2018-02-08 DIAGNOSIS — Z114 Encounter for screening for human immunodeficiency virus [HIV]: Secondary | ICD-10-CM

## 2018-02-08 DIAGNOSIS — Z0000 Encounter for general adult medical examination without abnormal findings: Secondary | ICD-10-CM | POA: Diagnosis not present

## 2018-02-08 DIAGNOSIS — Z130 Encounter for screening for diseases of the blood and blood-forming organs and certain disorders involving the immune mechanism: Secondary | ICD-10-CM | POA: Diagnosis not present

## 2018-02-08 DIAGNOSIS — Z1329 Encounter for screening for other suspected endocrine disorder: Secondary | ICD-10-CM

## 2018-02-08 MED ORDER — BUPROPION HCL ER (XL) 300 MG PO TB24: 300.0000 mg | ORAL_TABLET | Freq: Every day | ORAL | 3 refills | Status: DC

## 2018-02-08 MED ORDER — SERTRALINE HCL 50 MG PO TABS: 50.0000 mg | ORAL_TABLET | Freq: Every day | ORAL | 3 refills | Status: DC

## 2018-02-08 MED FILL — SERTRALINE HCL 50 MG TABLET: 50 | 90 days supply | Qty: 90 | Fill #0

## 2018-02-08 MED FILL — BUPROPION HCL XL 300 MG TAB: 300 | 90 days supply | Qty: 90 | Fill #0

## 2018-02-08 NOTE — Progress Notes (Signed)
Subjective:    Patient ID: Abigail Lowe, female    DOB: 1991/07/13, 27 y.o.   MRN: [id number] Chief Complaint  Patient presents with  . Annual Exam    HPI  27 yo female presents for annual PE. Reports feeling tired and sluggish, going on for about a month. Wellbutrin is helping with her depression, but has no helped her feelings of exhausted. Reports going to bed last night at 5pm and waking up today at 6am.  She likes Wellbutrin, helps with appetite and depression but does not provide enough relief for anxiety. Sertraline helps with depression, but still having anxiety.  Cervical Cancer Screening: Previously referred, states she will call this week. Last pap in 2015 at [facility] - normal per pt. Breast Cancer Screening: Not yet candidate Colorectal Cancer Screening: Not yet candidate Bone Density Testing: Not yet candidate HIV Screening: Today, 02/08/2018 STI Screening: Referral to GYN Seasonal Influenza Vaccination: Due 05/20/2018 Td/Tdap Vaccination: Due 12/15/2018 Pneumococcal Vaccination: Not yet candidate Zoster Vaccination: Not yet candidate Frequency of Dental evaluation: 2 years ago. Frequency of Eye evaluation: Reports going a few months ago.   Review of Systems  Constitutional: Positive for fatigue. Negative for appetite change, chills, diaphoresis and fever.  HENT: Positive for postnasal drip. Negative for congestion, rhinorrhea, sinus pressure, sinus pain, sneezing and sore throat.   Eyes: Positive for photophobia. Negative for pain, redness, itching and visual disturbance.  Respiratory: Negative for cough, choking, chest tightness, shortness of breath, wheezing and stridor.   Cardiovascular: Negative for chest pain, palpitations and leg swelling.  Gastrointestinal: Negative for abdominal pain, diarrhea, nausea and vomiting.  Endocrine: Negative.   Genitourinary: Negative.   Musculoskeletal: Negative.   Allergic/Immunologic: Negative.   Neurological:  Negative.   Hematological: Negative.   Psychiatric/Behavioral: The patient is nervous/anxious.      Patient Active Problem List   Diagnosis Date Noted  . Depression 12/18/2017  . Generalized anxiety disorder 12/18/2017    Past Medical History:  Diagnosis Date  . Anxiety   . Depression   . Gestational diabetes   . Medical history non-contributory   . No pertinent past medical history   . Sore throat 12/15/2017  . Strep pharyngitis 12/15/2017  . Symptomatic cholelithiasis 11/23/2016    Prior to Admission medications   Medication Sig Start Date End Date Taking? Authorizing Provider  buPROPion (WELLBUTRIN XL) 150 MG 24 hr tablet Take 1 tablet (150 mg total) by mouth daily. 01/16/18  Yes Jeffery, [name], [name]  sertraline (ZOLOFT) 50 MG tablet Take 1 tablet (50 mg total) by mouth daily. 12/18/17  Yes Jeffery, [name], [name]    No Known Allergies      Objective:   Physical Exam  Constitutional: She is oriented to person, place, and time. She appears well-developed and well-nourished. No distress.  BP 104/76   Pulse (!) 104   Temp 98.1 F (36.7 C)   Resp 16   Ht 5' 2.5" (1.588 m)   Wt 154 lb 9.6 oz (70.1 kg)   LMP 12/09/2017 (LMP Unknown) Comment: pt late & states usually late  SpO2 99%   BMI 27.83 kg/m    HENT:  Head: Normocephalic and atraumatic.  Nose: Nose normal.  Eyes: Pupils are equal, round, and reactive to light. Conjunctivae and EOM are normal. Right eye exhibits no discharge. Left eye exhibits no discharge.  Neck: Normal range of motion. Neck supple. No tracheal deviation present. No thyromegaly present.  Cardiovascular: Normal rate, regular rhythm and intact distal pulses.  Exam reveals no gallop and no friction rub.  No murmur heard. Pulses:      Radial pulses are 2+ on the right side, and 2+ on the left side.       Dorsalis pedis pulses are 2+ on the right side, and 2+ on the left side.       Posterior tibial pulses are 2+ on the right side, and 2+ on the left  side.  Pulmonary/Chest: Effort normal and breath sounds normal. No respiratory distress. She has no wheezes. She has no rales.  Musculoskeletal: Normal range of motion. She exhibits no edema.  Neurological: She is alert and oriented to person, place, and time. She has normal reflexes.  Skin: Skin is warm and dry. She is not diaphoretic. No erythema.  Psychiatric: She has a normal mood and affect. Her behavior is normal.     GAD 7 : Generalized Anxiety Score 01/16/2018 12/18/2017  Nervous, Anxious, on Edge 3 3  Control/stop worrying 3 3  Worry too much - different things 3 3  Trouble relaxing 3 3  Restless 3 3  Easily annoyed or irritable 2 2  Afraid - awful might happen 1 3  Total GAD 7 Score 18 20  Anxiety Difficulty Very difficult Very difficult   Depression screen [facility] 2/9 02/08/2018 02/08/2018 01/16/2018 12/18/2017 12/15/2017  Decreased Interest 0 0 0 2 0  Down, Depressed, Hopeless 0 0 0 2 0  PHQ - 2 Score 0 0 0 4 0  Altered sleeping 3 - 3 3 -  Tired, decreased energy 3 - 3 3 -  Change in appetite 0 - 2 2 -  Feeling bad or failure about yourself  0 - 0 2 -  Trouble concentrating 0 - 0 1 -  Moving slowly or fidgety/restless 3 - 2 2 -  Suicidal thoughts 0 - 0 1 -  PHQ-9 Score 9 - - 18 -  Difficult doing work/chores Not difficult at all - Extremely dIfficult Somewhat difficult -       Assessment & Plan:  1. Annual physical exam Await labs and f/u.   2. Screening for metabolic disorder Await labs and f/u.  - Lipid panel - Urinalysis, dipstick only - Comprehensive metabolic panel  3. Screening for iron deficiency anemia Await labs and f/u.  - CBC with Differential/Platelet  4. Screening for HIV (human immunodeficiency virus) Await labs and f/u.  - HIV antibody  5. Screening for thyroid disorder Await labs and f/u.  - TSH  6. Pap smear for cervical cancer screening Pt states she will schedule with gynecology  7. Episode of recurrent major depressive disorder,  unspecified depression episode severity (HCC) Increase dose of bupropion to 300mg  to help assist with persistent anxiety and depressive symptoms. Previous dose did not provide enough benefits for anxiety and depression.  - buPROPion (WELLBUTRIN XL) 300 MG 24 hr tablet; Take 1 tablet (300 mg total) by mouth daily.  Dispense: 90 tablet; Refill: 3 - sertraline (ZOLOFT) 50 MG tablet; Take 1 tablet (50 mg total) by mouth daily.  Dispense: 90 tablet; Refill: 3  8. Generalized anxiety disorder Increase dose of bupropion to 300mg  to help assist with persistent anxiety and depressive symptoms. Previous dose did not provide enough benefits for anxiety and depression.  - buPROPion (WELLBUTRIN XL) 300 MG 24 hr tablet; Take 1 tablet (300 mg total) by mouth daily.  Dispense: 90 tablet; Refill: 3 - sertraline (ZOLOFT) 50 MG tablet; Take 1 tablet (50 mg total) by mouth daily.  Dispense: 90 tablet; Refill: 3  Return in about 1 month (around 03/08/2018) for re-evalaution of mood.

## 2018-02-08 NOTE — Assessment & Plan Note (Signed)
Improved. Continue sertraline 50 mg and INCREASE bupropion XL from 150 to 300 mg to achieve better anxiety control and daytime somnolence. Consider d/c sertraline if can adequately treat with bupropion alone.

## 2018-02-08 NOTE — Assessment & Plan Note (Signed)
Improved, but not adequately controlled. Continue sertraline 50 mg and INCREASE bupropion XL from 150 to 300 mg to achieve better anxiety control and daytime somnolence. Consider d/c sertraline if can adequately treat with bupropion alone.

## 2018-02-08 NOTE — Progress Notes (Signed)
Patient ID: ADLINE KIRSHENBAUM, female    DOB: 1991-09-29, 27 y.o.   MRN: [id number]  PCP: Porfirio Oar, [name]  Chief Complaint  Patient presents with  . Annual Exam    Subjective:   Presents for [employer].  I saw her last month to address anxiety and depression. First, sertraline was started, which helped some, but caused nausea, so she takes it at bedtime. Bupropion was added, which she is tolerating well, and helps some, but not enough. She continues to experience anxiety, sleep disruption, daytime somnolence. She notes that the depressive symptoms feel controlled now, but the anxiety is not.  Cervical Cancer Screening: due to family history of cervical cancer, patient prefers to see GYN for this, but has not yet scheduled to do so. Breast Cancer Screening: not yet a candidate for mammography Colorectal Cancer Screening: not yet a candidate Bone Density Testing: not yet a candidate HIV Screening: today STI Screening: declined, low risk Seasonal Influenza Vaccination: current, annual Td/Tdap Vaccination: reportedly current, through her employer Pneumococcal Vaccination: not yet a candidate Zoster Vaccination: not yet a candidate Frequency of Dental evaluation: last visit 2 years ago Frequency of Eye evaluation: several months ago   Review of Systems Constitutional: Positive for fatigue. Negative for appetite change, chills, diaphoresis and fever.  HENT: Positive for postnasal drip. Negative for congestion, rhinorrhea, sinus pressure, sinus pain, sneezing and sore throat.   Eyes: Positive for photophobia. Negative for pain, redness, itching and visual disturbance.  Respiratory: Negative for cough, choking, chest tightness, shortness of breath, wheezing and stridor.   Cardiovascular: Negative for chest pain, palpitations and leg swelling.  Gastrointestinal: Negative for abdominal pain, diarrhea, nausea and vomiting.  Endocrine: Negative.   Genitourinary: Negative.    Musculoskeletal: Negative.   Allergic/Immunologic: Negative.   Neurological: Negative.   Hematological: Negative.   Psychiatric/Behavioral: The patient is nervous/anxious.      Depression screen [facility] 2/9 02/08/2018 02/08/2018 01/16/2018 12/18/2017 12/15/2017  Decreased Interest 0 0 0 2 0  Down, Depressed, Hopeless 0 0 0 2 0  PHQ - 2 Score 0 0 0 4 0  Altered sleeping 3 - 3 3 -  Tired, decreased energy 3 - 3 3 -  Change in appetite 0 - 2 2 -  Feeling bad or failure about yourself  0 - 0 2 -  Trouble concentrating 0 - 0 1 -  Moving slowly or fidgety/restless 3 - 2 2 -  Suicidal thoughts 0 - 0 1 -  PHQ-9 Score 9 - - 18 -  Difficult doing work/chores Not difficult at all - Extremely dIfficult Somewhat difficult -    GAD 7 : Generalized Anxiety Score 02/08/2018 01/16/2018 12/18/2017  Nervous, Anxious, on Edge 3 3 3   Control/stop worrying 3 3 3   Worry too much - different things 3 3 3   Trouble relaxing 3 3 3   Restless 3 3 3   Easily annoyed or irritable 1 2 2   Afraid - awful might happen 1 1 3   Total GAD 7 Score 17 18 20   Anxiety Difficulty - Very difficult Very difficult     Patient Active Problem List   Diagnosis Date Noted  . Depression 12/18/2017  . Generalized anxiety disorder 12/18/2017    Past Medical History:  Diagnosis Date  . Anxiety   . Depression   . Gestational diabetes   . Medical history non-contributory   . No pertinent past medical history   . Sore throat 12/15/2017  . Strep pharyngitis 12/15/2017  .  Symptomatic cholelithiasis 11/23/2016     Prior to Admission medications   Medication Sig Start Date End Date Taking? Authorizing Provider  buPROPion (WELLBUTRIN XL) 150 MG 24 hr tablet Take 1 tablet (150 mg total) by mouth daily. 01/16/18  Yes Trenace Coughlin, [name]  sertraline (ZOLOFT) 50 MG tablet Take 1 tablet (50 mg total) by mouth daily. 12/18/17  Yes Porfirio Oar, [name]    No Known Allergies  Past Surgical History:  Procedure Laterality Date  .  CHOLECYSTECTOMY N/A 11/25/2016   Procedure: LAPAROSCOPIC CHOLECYSTECTOMY WITH INTRAOPERATIVE CHOLANGIOGRAM;  Surgeon: Manus Rudd, MD;  Location: [facility] OR;  Service: General;  Laterality: N/A;  . INTRAUTERINE DEVICE (IUD) INSERTION    . NO PAST SURGERIES      Family History  Problem Relation Age of Onset  . Hypertension Father   . Hyperlipidemia Father   . Heart disease Maternal Grandfather   . Cancer Maternal Grandfather   . Cervical cancer Mother   . ADD / ADHD Brother   . Hypertension Maternal Grandmother   . Cervical cancer Maternal Grandmother   . Cancer Paternal Grandfather   . Diabetes Paternal Grandfather   . Hyperlipidemia Paternal Grandfather     Social History   Socioeconomic History  . Marital status: Married    Spouse name: [name]  . Number of children: 2  . Years of education: Not on file  . Highest education level: Not on file  Occupational History  . Not on file  Social Needs  . Financial resource strain: Not hard at all  . Food insecurity:    Worry: Never true    Inability: Never true  . Transportation needs:    Medical: No    Non-medical: No  Tobacco Use  . Smoking status: Never Smoker  . Smokeless tobacco: Never Used  Substance and Sexual Activity  . Alcohol use: No  . Drug use: No  . Sexual activity: Yes    Birth control/protection: Condom  Lifestyle  . Physical activity:    Days per week: 0 days    Minutes per session: 0 min  . Stress: Rather much  Relationships  . Social connections:    Talks on phone: More than three times a week    Gets together: Once a week    Attends religious service: More than 4 times per year    Active member of club or organization: Yes    Attends meetings of clubs or organizations: More than 4 times per year    Relationship status: Married  Other Topics Concern  . Not on file  Social History Narrative   Live with husband [name] and 2 children in [place].         Objective:  Physical Exam   Constitutional: She is oriented to person, place, and time. Vital signs are normal. She appears well-developed and well-nourished. She is active and cooperative. No distress.  BP 104/76   Pulse (!) 104   Temp 98.1 F (36.7 C)   Resp 16   Ht 5' 2.5" (1.588 m)   Wt 154 lb 9.6 oz (70.1 kg)   LMP 12/09/2017 (LMP Unknown) Comment: pt late & states usually late  SpO2 99%   BMI 27.83 kg/m    HENT:  Head: Normocephalic and atraumatic.  Right Ear: Hearing, tympanic membrane, external ear and ear canal normal. No foreign bodies.  Left Ear: Hearing, tympanic membrane, external ear and ear canal normal. No foreign bodies.  Nose: Nose normal.  Mouth/Throat: Uvula is midline, oropharynx  is clear and moist and mucous membranes are normal. No oral lesions. Normal dentition. No dental abscesses or uvula swelling. No oropharyngeal exudate.  Eyes: Pupils are equal, round, and reactive to light. Conjunctivae, EOM and lids are normal. Right eye exhibits no discharge. Left eye exhibits no discharge. No scleral icterus.  Fundoscopic exam:      The right eye shows no arteriolar narrowing, no AV nicking, no exudate, no hemorrhage and no papilledema. The right eye shows red reflex.       The left eye shows no arteriolar narrowing, no AV nicking, no exudate, no hemorrhage and no papilledema. The left eye shows red reflex.  Neck: Trachea normal, normal range of motion and full passive range of motion without pain. Neck supple. No spinous process tenderness and no muscular tenderness present. No thyroid mass and no thyromegaly present.  Cardiovascular: Normal rate, regular rhythm, normal heart sounds, intact distal pulses and normal pulses.  Pulmonary/Chest: Effort normal and breath sounds normal.  Musculoskeletal: She exhibits no edema or tenderness.       Cervical back: Normal.       Thoracic back: Normal.       Lumbar back: Normal.  Lymphadenopathy:       Head (right side): No tonsillar, no preauricular, no  posterior auricular and no occipital adenopathy present.       Head (left side): No tonsillar, no preauricular, no posterior auricular and no occipital adenopathy present.    She has no cervical adenopathy.       Right: No supraclavicular adenopathy present.       Left: No supraclavicular adenopathy present.  Neurological: She is alert and oriented to person, place, and time. She has normal strength and normal reflexes. No cranial nerve deficit. She exhibits normal muscle tone. Coordination and gait normal.  Skin: Skin is warm, dry and intact. No rash noted. She is not diaphoretic. No cyanosis or erythema. Nails show no clubbing.  Psychiatric: She has a normal mood and affect. Her speech is normal and behavior is normal. Judgment and thought content normal.   Pap deferred, as patient desires to schedule with GYN.  Wt Readings from Last 3 Encounters:  02/08/18 154 lb 9.6 oz (70.1 kg)  01/16/18 160 lb (72.6 kg)  12/18/17 161 lb (73 kg)     Visual Acuity Screening   Right eye Left eye Both eyes  Without correction:     With correction: 20/20 20/20 20/15       Assessment & Plan:   Problem List Items Addressed This Visit    Depression (Chronic)    Improved. Continue sertraline 50 mg and INCREASE bupropion XL from 150 to 300 mg to achieve better anxiety control and daytime somnolence. Consider d/c sertraline if can adequately treat with bupropion alone.      Relevant Medications   buPROPion (WELLBUTRIN XL) 300 MG 24 hr tablet   sertraline (ZOLOFT) 50 MG tablet   Generalized anxiety disorder    Improved, but not adequately controlled. Continue sertraline 50 mg and INCREASE bupropion XL from 150 to 300 mg to achieve better anxiety control and daytime somnolence. Consider d/c sertraline if can adequately treat with bupropion alone.      Relevant Medications   buPROPion (WELLBUTRIN XL) 300 MG 24 hr tablet   sertraline (ZOLOFT) 50 MG tablet    Other Visit Diagnoses    Annual physical  exam    -  Primary   Age appropriate health guidance provided.   Screening for metabolic  disorder       Relevant Orders   Lipid panel   Urinalysis, dipstick only   Comprehensive metabolic panel   Screening for iron deficiency anemia       Relevant Orders   CBC with Differential/Platelet   Screening for HIV (human immunodeficiency virus)       Relevant Orders   HIV antibody   Screening for thyroid disorder       Relevant Orders   TSH       Return in about 1 month (around 03/08/2018) for re-evalaution of mood.   Fernande Brashelle S. Llesenia Fogal, [name] Primary Care at [facility]

## 2018-02-08 NOTE — Patient Instructions (Addendum)
Please call GYN to schedule your visit. Set up your my chart account so that you get an alert when you have new information (either an e-mail or a text alert).    IF you received an x-ray today, you will receive an invoice from [facility]. Please contact [facility] at [phone] with questions or concerns regarding your invoice.   IF you received labwork today, you will receive an invoice from [facility]. Please contact [facility] at [phone] with questions or concerns regarding your invoice.   Our billing staff will not be able to assist you with questions regarding bills from these companies.  You will be contacted with the lab results as soon as they are available. The fastest way to get your results is to activate your My Chart account. Instructions are located on the last page of this paperwork. If you have not heard from [place] regarding the results in 2 weeks, please contact this office.      Preventive Care 18-39 Years, Female Preventive care refers to lifestyle choices and visits with your health care provider that can promote health and wellness. What does preventive care include?  A yearly physical exam. This is also called an annual well check.  Dental exams once or twice a year.  Routine eye exams. Ask your health care provider how often you should have your eyes checked.  Personal lifestyle choices, including: ? Daily care of your teeth and gums. ? Regular physical activity. ? Eating a healthy diet. ? Avoiding tobacco and drug use. ? Limiting alcohol use. ? Practicing safe sex. ? Taking vitamin and mineral supplements as recommended by your health care provider. What happens during an annual well check? The services and screenings done by your health care provider during your annual well check will depend on your age, overall health, lifestyle risk factors, and family history of disease. Counseling Your health care provider may ask you questions  about your:  Alcohol use.  Tobacco use.  Drug use.  Emotional well-being.  Home and relationship well-being.  Sexual activity.  Eating habits.  Work and work Statistician.  Method of birth control.  Menstrual cycle.  Pregnancy history.  Screening You may have the following tests or measurements:  Height, weight, and BMI.  Diabetes screening. This is done by checking your blood sugar (glucose) after you have not eaten for a while (fasting).  Blood pressure.  Lipid and cholesterol levels. These may be checked every 5 years starting at age 29.  Skin check.  Hepatitis C blood test.  Hepatitis B blood test.  Sexually transmitted disease (STD) testing.  BRCA-related cancer screening. This may be done if you have a family history of breast, ovarian, tubal, or peritoneal cancers.  Pelvic exam and Pap test. This may be done every 3 years starting at age 73. Starting at age 56, this may be done every 5 years if you have a Pap test in combination with an HPV test.  Discuss your test results, treatment options, and if necessary, the need for more tests with your health care provider. Vaccines Your health care provider may recommend certain vaccines, such as:  Influenza vaccine. This is recommended every year.  Tetanus, diphtheria, and acellular pertussis (Tdap, Td) vaccine. You may need a Td booster every 10 years.  Varicella vaccine. You may need this if you have not been vaccinated.  HPV vaccine. If you are 75 or younger, you may need three doses over 6 months.  Measles, mumps, and rubella (MMR) vaccine.  You may need at least one dose of MMR. You may also need a second dose.  Pneumococcal 13-valent conjugate (PCV13) vaccine. You may need this if you have certain conditions and were not previously vaccinated.  Pneumococcal polysaccharide (PPSV23) vaccine. You may need one or two doses if you smoke cigarettes or if you have certain conditions.  Meningococcal  vaccine. One dose is recommended if you are age 75-21 years and a first-year college student living in a residence hall, or if you have one of several medical conditions. You may also need additional booster doses.  Hepatitis A vaccine. You may need this if you have certain conditions or if you travel or work in places where you may be exposed to hepatitis A.  Hepatitis B vaccine. You may need this if you have certain conditions or if you travel or work in places where you may be exposed to hepatitis B.  Haemophilus influenzae type b (Hib) vaccine. You may need this if you have certain risk factors.  Talk to your health care provider about which screenings and vaccines you need and how often you need them. This information is not intended to replace advice given to you by your health care provider. Make sure you discuss any questions you have with your health care provider. Document Released: 12/02/2001 Document Revised: 06/25/2016 Document Reviewed: 08/07/2015 Elsevier Interactive Patient Education  [employer].

## 2018-02-09 LAB — LIPID PANEL
Chol/HDL Ratio: 3.2 ratio (ref 0.0–4.4)
Cholesterol, Total: 158 mg/dL (ref 100–199)
HDL: 50 mg/dL (ref >39)
LDL Calculated: 95 mg/dL (ref 0–99)
Triglycerides: 63 mg/dL (ref 0–149)
VLDL Cholesterol Cal: 13 mg/dL (ref 5–40)

## 2018-02-09 LAB — CBC WITH DIFFERENTIAL/PLATELET
Basophils Absolute: 0.1 10*3/uL (ref 0.0–0.2)
Basos: 1 %
EOS (ABSOLUTE): 0.2 10*3/uL (ref 0.0–0.4)
Eos: 4 %
Hematocrit: 43.8 % (ref 34.0–46.6)
Hemoglobin: 14.6 g/dL (ref 11.1–15.9)
Immature Grans (Abs): 0 10*3/uL (ref 0.0–0.1)
Immature Granulocytes: 0 %
Lymphocytes Absolute: 1.7 10*3/uL (ref 0.7–3.1)
Lymphs: 31 %
MCH: 29 pg (ref 26.6–33.0)
MCHC: 33.3 g/dL (ref 31.5–35.7)
MCV: 87 fL (ref 79–97)
Monocytes Absolute: 0.4 10*3/uL (ref 0.1–0.9)
Monocytes: 6 %
Neutrophils Absolute: 3.3 10*3/uL (ref 1.4–7.0)
Neutrophils: 58 %
Platelets: 279 10*3/uL (ref 150–379)
RBC: 5.04 x10E6/uL (ref 3.77–5.28)
RDW: 13.6 % (ref 12.3–15.4)
WBC: 5.6 10*3/uL (ref 3.4–10.8)

## 2018-02-09 LAB — COMPREHENSIVE METABOLIC PANEL
ALT: 12 IU/L (ref 0–32)
AST: 20 IU/L (ref 0–40)
Albumin/Globulin Ratio: 1.6 (ref 1.2–2.2)
Albumin: 4.6 g/dL (ref 3.5–5.5)
Alkaline Phosphatase: 79 IU/L (ref 39–117)
BUN/Creatinine Ratio: 14 (ref 9–23)
BUN: 10 mg/dL (ref 6–20)
Bilirubin Total: <0.2 mg/dL (ref 0.0–1.2)
CO2: 25 mmol/L (ref 20–29)
Calcium: 9.7 mg/dL (ref 8.7–10.2)
Chloride: 103 mmol/L (ref 96–106)
Creatinine, Ser: 0.74 mg/dL (ref 0.57–1.00)
GFR calc Af Amer: 129 mL/min/{1.73_m2} (ref >59)
GFR calc non Af Amer: 112 mL/min/{1.73_m2} (ref >59)
Globulin, Total: 2.9 g/dL (ref 1.5–4.5)
Glucose: 86 mg/dL (ref 65–99)
Potassium: 4.9 mmol/L (ref 3.5–5.2)
Sodium: 140 mmol/L (ref 134–144)
Total Protein: 7.5 g/dL (ref 6.0–8.5)

## 2018-02-09 LAB — URINALYSIS, DIPSTICK ONLY
Bilirubin, UA: NEGATIVE
Glucose, UA: NEGATIVE
Ketones, UA: NEGATIVE
Nitrite, UA: NEGATIVE
Protein, UA: NEGATIVE
RBC, UA: NEGATIVE
Specific Gravity, UA: 1.024 (ref 1.005–1.030)
Urobilinogen, Ur: 0.2 mg/dL (ref 0.2–1.0)
pH, UA: 6 (ref 5.0–7.5)

## 2018-02-09 LAB — TSH: TSH: 2.53 u[IU]/mL (ref 0.450–4.500)

## 2018-02-09 LAB — HIV ANTIBODY (ROUTINE TESTING W REFLEX): HIV Screen 4th Generation wRfx: NONREACTIVE

## 2018-03-19 ENCOUNTER — Ambulatory Visit: Payer: 59 | Admitting: Physician Assistant

## 2018-03-19 ENCOUNTER — Encounter: Payer: Self-pay | Admitting: Physician Assistant

## 2018-03-19 ENCOUNTER — Other Ambulatory Visit: Payer: Self-pay

## 2018-03-19 VITALS — BP 112/66 | HR 98 | Temp 98.8°F | Resp 16 | Ht 62.5 in | Wt 150.4 lb

## 2018-03-19 DIAGNOSIS — F411 Generalized anxiety disorder: Secondary | ICD-10-CM | POA: Diagnosis not present

## 2018-03-19 DIAGNOSIS — F339 Major depressive disorder, recurrent, unspecified: Secondary | ICD-10-CM | POA: Diagnosis not present

## 2018-03-19 MED ORDER — SERTRALINE HCL 100 MG PO TABS: 100.0000 mg | ORAL_TABLET | Freq: Every day | ORAL | 3 refills | Status: DC

## 2018-03-19 NOTE — Progress Notes (Signed)
Patient ID: Abigail Lowe, female    DOB: 03-17-1991, 27 y.o.   MRN: [id number]  PCP: Porfirio Oar, [name]  Chief Complaint  Patient presents with  . Depression    follow up     Subjective:   Presents for evaluation of depression. Mood continues to improve.  Is tolerating the lower dose of sertraline by taking it at HS., and the addition of bupropion, including some weight loss. Unfortunately she continues to struggle with feelings of guilt and low self-confidence. Anxiety also continues. Finds herself feeling anxious and worried most of the time.  Most of her anxiety symptoms stem from changes at work.   Review of Systems Constitutional: Negative for chills and fever.  Respiratory: Negative for chest tightness and shortness of breath.   Cardiovascular: Negative for chest pain.  Neurological: Negative for numbness and headaches.  Psychiatric/Behavioral: Negative for suicidal ideas. The patient is nervous/anxious.      Depression screen [facility] 2/9 03/19/2018 02/08/2018 02/08/2018 01/16/2018 12/18/2017  Decreased Interest 0 0 0 0 2  Down, Depressed, Hopeless 0 0 0 0 2  PHQ - 2 Score 0 0 0 0 4  Altered sleeping 0 3 - 3 3  Tired, decreased energy 1 3 - 3 3  Change in appetite 0 0 - 2 2  Feeling bad or failure about yourself  1 0 - 0 2  Trouble concentrating 0 0 - 0 1  Moving slowly or fidgety/restless 0 3 - 2 2  Suicidal thoughts 0 0 - 0 1  PHQ-9 Score 2 9 - - 18  Difficult doing work/chores Not difficult at all Not difficult at all - Extremely dIfficult Somewhat difficult    GAD 7 : Generalized Anxiety Score 03/19/2018 02/08/2018 01/16/2018 12/18/2017  Nervous, Anxious, on Edge 3 3 3 3   Control/stop worrying 3 3 3 3   Worry too much - different things 3 3 3 3   Trouble relaxing 3 3 3 3   Restless 3 3 3 3   Easily annoyed or irritable 1 1 2 2   Afraid - awful might happen 3 1 1 3   Total GAD 7 Score 19 17 18 20   Anxiety Difficulty Not difficult at all - Very difficult Very  difficult    Patient Active Problem List   Diagnosis Date Noted  . Depression 12/18/2017  . Generalized anxiety disorder 12/18/2017     Prior to Admission medications   Medication Sig Start Date End Date Taking? Authorizing Provider  buPROPion (WELLBUTRIN XL) 300 MG 24 hr tablet Take 1 tablet (300 mg total) by mouth daily. 02/08/18  Yes [name], [name]  sertraline (ZOLOFT) 50 MG tablet Take 1 tablet (50 mg total) by mouth daily. 02/08/18  Yes [name], [name]     No Known Allergies     Objective:  Physical Exam  Constitutional: She is oriented to person, place, and time. She appears well-developed and well-nourished. She is active and cooperative. No distress.  BP 112/66   Pulse 98   Temp 98.8 F (37.1 C)   Resp 16   Ht 5' 2.5" (1.588 m)   Wt 150 lb 6.4 oz (68.2 kg)   SpO2 98%   BMI 27.07 kg/m   HENT:  Head: Normocephalic and atraumatic.  Right Ear: Hearing normal.  Left Ear: Hearing normal.  Eyes: Conjunctivae are normal. No scleral icterus.  Neck: Normal range of motion. Neck supple. No thyromegaly present.  Cardiovascular: Normal rate, regular rhythm and normal heart sounds.  Pulses:  Radial pulses are 2+ on the right side, and 2+ on the left side.  Pulmonary/Chest: Effort normal and breath sounds normal.  Lymphadenopathy:       Head (right side): No tonsillar, no preauricular, no posterior auricular and no occipital adenopathy present.       Head (left side): No tonsillar, no preauricular, no posterior auricular and no occipital adenopathy present.    She has no cervical adenopathy.       Right: No supraclavicular adenopathy present.       Left: No supraclavicular adenopathy present.  Neurological: She is alert and oriented to person, place, and time. No sensory deficit.  Skin: Skin is warm, dry and intact. No rash noted. No cyanosis or erythema. Nails show no clubbing.  Psychiatric: She has a normal mood and affect. Her speech is normal and  behavior is normal.   Wt Readings from Last 3 Encounters:  03/19/18 150 lb 6.4 oz (68.2 kg)  02/08/18 154 lb 9.6 oz (70.1 kg)  01/16/18 160 lb (72.6 kg)           Assessment & Plan:   Problem List Items Addressed This Visit    Generalized anxiety disorder - Primary    Not adequately controlled.  Counseling.  Increase sertraline dose to 100 mg.      Relevant Medications   sertraline (ZOLOFT) 100 MG tablet   Depression (Chronic)    Well-controlled.      Relevant Medications   sertraline (ZOLOFT) 100 MG tablet       Return in about 6 weeks (around 04/30/2018) for re-evaluation of mood with PA Weber or PA Barnett AbuWiseman.   Fernande Brashelle S. Kazuto Sevey, [name] [facility]

## 2018-03-19 NOTE — Progress Notes (Signed)
   Subjective:    Patient ID: Abigail Lowe, female    DOB: Feb 28, 1991, 27 y.o.   MRN: [id number]  Patient is presenting for follow up of depression/anxiety. She is happy to report weight loss associated with bupropion. She feels like her mood is significantly improved since starting bupropion and sertraline, but is still struggling with feelings of guilt and low self-confidence. Of note, her current medication regimen is only helping slightly with anxiety. She still feels anxious/stressed/worried all the time.      Review of Systems  Constitutional: Negative for chills and fever.  Respiratory: Negative for chest tightness and shortness of breath.   Cardiovascular: Negative for chest pain.  Neurological: Negative for numbness and headaches.  Psychiatric/Behavioral: Negative for suicidal ideas. The patient is nervous/anxious.         Patient Active Problem List   Diagnosis Date Noted  . Depression 12/18/2017  . Generalized anxiety disorder 12/18/2017   Prior to Admission medications   Medication Sig Start Date End Date Taking? Authorizing Provider  buPROPion (WELLBUTRIN XL) 300 MG 24 hr tablet Take 1 tablet (300 mg total) by mouth daily. 02/08/18  Yes Jeffery, [name], [name]  sertraline (ZOLOFT) 50 MG tablet Take 1 tablet (50 mg total) by mouth daily. 02/08/18  Yes Jeffery, [name], [name]   No Known Allergies  Objective:   Physical Exam  Constitutional: She is oriented to person, place, and time. She appears well-developed and well-nourished.  HENT:  Head: Normocephalic and atraumatic.  Eyes: Pupils are equal, round, and reactive to light.  Neck: No thyromegaly present.  Cardiovascular: Normal rate, regular rhythm and normal heart sounds. Exam reveals no gallop and no friction rub.  No murmur heard. Pulses:      Radial pulses are 2+ on the right side, and 2+ on the left side.  Pulmonary/Chest: Effort normal and breath sounds normal. She has no wheezes. She has no rales.    Lymphadenopathy:    She has no cervical adenopathy.  Neurological: She is alert and oriented to person, place, and time.  Skin: Skin is warm and dry.     Vitals:   03/19/18 1637  BP: 112/66  Pulse: 98  Resp: 16  Temp: 98.8 F (37.1 C)  SpO2: 98%        Assessment & Plan:  1. Generalized anxiety disorder -INCREASE dose of sertraline to better address symptoms of anxiety. -Spent time encouraging patient and discussing importance of therapy to help cope with and address Faulstich term effects of history of childhood trauma  -Discover personally beneficial phrases to self-motivate when thoughts of inadequacy flood mind. - sertraline (ZOLOFT) 100 MG tablet; Take 1 tablet (100 mg total) by mouth daily.  Dispense: 90 tablet; Refill: 3  2. Episode of recurrent major depressive disorder, unspecified depression episode severity (HCC) Well controlled on current regimen of buproprion and sertraline, but increase sertraline for GAD. - sertraline (ZOLOFT) 100 MG tablet; Take 1 tablet (100 mg total) by mouth daily.  Dispense: 90 tablet; Refill: 3  Return in about 6 weeks (around 04/30/2018) for re-evaluation of mood with PA Weber or PA Barnett Abu.

## 2018-03-19 NOTE — Patient Instructions (Addendum)
You are good enough. You are perfect, just as you are!  For therapy -- [facility] ([address] [place]) [place] [phone] [facility] [address] Jerilynn Mages [place]) - [phone] [facility] - [phone] [facility] - [phone] [facility] Irish - [phone] Jeanella Flattery - [facility], [phone] [facility]  - [phone] (do not file insurance) Paula Compton [phone] Doy[facility] Keel - [phone] Jeanella Flattery - [facility], [phone] [facility] - [phone]  Increase the sertraline from 50 mg to 100 mg.   IF you received an x-ray today, you will receive an invoice from [facility]. Please contact [facility] at [phone] with questions or concerns regarding your invoice.   IF you received labwork today, you will receive an invoice from [place] [facility]. Please contact [facility] at [phone] with questions or concerns regarding your invoice.   Our billing staff will not be able to assist you with questions regarding bills from these companies.  You will be contacted with the lab results as soon as they are available. The fastest way to get your results is to activate your My Chart account. Instructions are located on the last page of this paperwork. If you have not heard from [place] regarding the results in 2 weeks, please contact this office.

## 2018-03-22 NOTE — Assessment & Plan Note (Signed)
Well controlled 

## 2018-03-22 NOTE — Assessment & Plan Note (Addendum)
Not adequately controlled.  Counseling.  Increase sertraline dose to 100 mg.

## 2018-03-23 ENCOUNTER — Ambulatory Visit (INDEPENDENT_AMBULATORY_CARE_PROVIDER_SITE_OTHER): Payer: 59 | Admitting: Urgent Care

## 2018-03-23 ENCOUNTER — Encounter: Payer: Self-pay | Admitting: Urgent Care

## 2018-03-23 ENCOUNTER — Other Ambulatory Visit: Payer: Self-pay

## 2018-03-23 VITALS — BP 108/76 | HR 96 | Temp 98.2°F | Resp 18

## 2018-03-23 DIAGNOSIS — F411 Generalized anxiety disorder: Secondary | ICD-10-CM | POA: Diagnosis not present

## 2018-03-23 DIAGNOSIS — E860 Dehydration: Secondary | ICD-10-CM | POA: Diagnosis not present

## 2018-03-23 DIAGNOSIS — R82998 Other abnormal findings in urine: Secondary | ICD-10-CM | POA: Diagnosis not present

## 2018-03-23 DIAGNOSIS — R42 Dizziness and giddiness: Secondary | ICD-10-CM

## 2018-03-23 DIAGNOSIS — R110 Nausea: Secondary | ICD-10-CM

## 2018-03-23 DIAGNOSIS — R55 Syncope and collapse: Secondary | ICD-10-CM | POA: Diagnosis not present

## 2018-03-23 LAB — POCT CBC
Granulocyte percent: 59.9 %G (ref 37–80)
HCT, POC: 44.2 % (ref 37.7–47.9)
Hemoglobin: 14.4 g/dL (ref 12.2–16.2)
Lymph, poc: 1.6 (ref 0.6–3.4)
MCH, POC: 28 pg (ref 27–31.2)
MCHC: 32.7 g/dL (ref 31.8–35.4)
MCV: 85.7 fL (ref 80–97)
MID (cbc): 0.4 (ref 0–0.9)
MPV: 7.7 fL (ref 0–99.8)
POC Granulocyte: 3 (ref 2–6.9)
POC LYMPH PERCENT: 32.2 %L (ref 10–50)
POC MID %: 7.9 %M (ref 0–12)
Platelet Count, POC: 293 10*3/uL (ref 142–424)
RBC: 5.16 M/uL (ref 4.04–5.48)
RDW, POC: 12.7 %
WBC: 5 10*3/uL (ref 4.6–10.2)

## 2018-03-23 LAB — POCT URINALYSIS DIP (MANUAL ENTRY)
Bilirubin, UA: NEGATIVE
Blood, UA: NEGATIVE
Glucose, UA: NEGATIVE mg/dL
Ketones, POC UA: NEGATIVE mg/dL
Nitrite, UA: NEGATIVE
Protein Ur, POC: NEGATIVE mg/dL
Spec Grav, UA: 1.015 (ref 1.010–1.025)
Urobilinogen, UA: 0.2 E.U./dL
pH, UA: 7.5 (ref 5.0–8.0)

## 2018-03-23 LAB — POCT URINE PREGNANCY: Preg Test, Ur: NEGATIVE

## 2018-03-23 MED ORDER — ONDANSETRON HCL 4 MG/2ML IJ SOLN
4.0000 mg | Freq: Once | INTRAMUSCULAR | Status: AC
  Administered 2018-03-23: 4 mg via INTRAVENOUS

## 2018-03-23 MED ORDER — ONDANSETRON 8 MG PO TBDP
8.0000 mg | ORAL_TABLET | Freq: Three times a day (TID) | ORAL | 0 refills | Status: DC | PRN
Start: 2018-03-23 — End: 2018-06-17

## 2018-03-23 MED ORDER — SODIUM CHLORIDE 0.9 % IV SOLN
Freq: Once | INTRAVENOUS | Status: AC
  Administered 2018-03-23: 16:00:00 via INTRAVENOUS

## 2018-03-23 NOTE — Patient Instructions (Signed)
Hydrate well with at least 2 liters (1 gallon) of water daily.    Dehydration, Adult Dehydration is a condition in which there is not enough fluid or water in the body. This happens when you lose more fluids than you take in. Important organs, such as the kidneys, brain, and heart, cannot function without a proper amount of fluids. Any loss of fluids from the body can lead to dehydration. Dehydration can range from mild to severe. This condition should be treated right away to prevent it from becoming severe. What are the causes? This condition may be caused by:  Vomiting.  Diarrhea.  Excessive sweating, such as from heat exposure or exercise.  Not drinking enough fluid, especially: ? When ill. ? While doing activity that requires a lot of energy.  Excessive urination.  Fever.  Infection.  Certain medicines, such as medicines that cause the body to lose excess fluid (diuretics).  Inability to access safe drinking water.  Reduced physical ability to get adequate water and food.  What increases the risk? This condition is more likely to develop in people:  Who have a poorly controlled [place]-term (chronic) illness, such as diabetes, heart disease, or kidney disease.  Who are age 9 or older.  Who are disabled.  Who live in a place with high altitude.  Who play endurance sports.  What are the signs or symptoms? Symptoms of mild dehydration may include:  Thirst.  Dry lips.  Slightly dry mouth.  Dry, warm skin.  Dizziness. Symptoms of moderate dehydration may include:  Very dry mouth.  Muscle cramps.  Dark urine. Urine may be the color of tea.  Decreased urine production.  Decreased tear production.  Heartbeat that is irregular or faster than normal (palpitations).  Headache.  Light-headedness, especially when you stand up from a sitting position.  Fainting (syncope). Symptoms of severe dehydration may include:  Changes in skin, such as: ? Cold  and clammy skin. ? Blotchy (mottled) or pale skin. ? Skin that does not quickly return to normal after being lightly pinched and released (poor skin turgor).  Changes in body fluids, such as: ? Extreme thirst. ? No tear production. ? Inability to sweat when body temperature is high, such as in hot weather. ? Very little urine production.  Changes in vital signs, such as: ? Weak pulse. ? Pulse that is more than 100 beats a minute when sitting still. ? Rapid breathing. ? Low blood pressure.  Other changes, such as: ? Sunken eyes. ? Cold hands and feet. ? Confusion. ? Lack of energy (lethargy). ? Difficulty waking up from sleep. ? Short-term weight loss. ? Unconsciousness. How is this diagnosed? This condition is diagnosed based on your symptoms and a physical exam. Blood and urine tests may be done to help confirm the diagnosis. How is this treated? Treatment for this condition depends on the severity. Mild or moderate dehydration can often be treated at home. Treatment should be started right away. Do not wait until dehydration becomes severe. Severe dehydration is an emergency and it needs to be treated in a hospital. Treatment for mild dehydration may include:  Drinking more fluids.  Replacing salts and minerals in your blood (electrolytes) that you may have lost. Treatment for moderate dehydration may include:  Drinking an oral rehydration solution (ORS). This is a drink that helps you replace fluids and electrolytes (rehydrate). It can be found at pharmacies and retail stores. Treatment for severe dehydration may include:  Receiving fluids through an IV tube.  Receiving an electrolyte solution through a feeding tube that is passed through your nose and into your stomach (nasogastric tube, or NG tube).  Correcting any abnormalities in electrolytes.  Treating the underlying cause of dehydration. Follow these instructions at home:  If directed by your health care  provider, drink an ORS: ? Make an ORS by following instructions on the package. ? Start by drinking small amounts, about  cup (120 mL) every 5-10 minutes. ? Slowly increase how much you drink until you have taken the amount recommended by your health care provider.  Drink enough clear fluid to keep your urine clear or pale yellow. If you were told to drink an ORS, finish the ORS first, then start slowly drinking other clear fluids. Drink fluids such as: ? Water. Do not drink only water. Doing that can lead to having too little salt (sodium) in the body (hyponatremia). ? Ice chips. ? Fruit juice that you have added water to (diluted fruit juice). ? Low-calorie sports drinks.  Avoid: ? Alcohol. ? Drinks that contain a lot of sugar. These include high-calorie sports drinks, fruit juice that is not diluted, and soda. ? Caffeine. ? Foods that are greasy or contain a lot of fat or sugar.  Take over-the-counter and prescription medicines only as told by your health care provider.  Do not take sodium tablets. This can lead to having too much sodium in the body (hypernatremia).  Eat foods that contain a healthy balance of electrolytes, such as bananas, oranges, potatoes, tomatoes, and spinach.  Keep all follow-up visits as told by your health care provider. This is important. Contact a health care provider if:  You have abdominal pain that: ? Gets worse. ? Stays in one area (localizes).  You have a rash.  You have a stiff neck.  You are more irritable than usual.  You are sleepier or more difficult to wake up than usual.  You feel weak or dizzy.  You feel very thirsty.  You have urinated only a small amount of very dark urine over 6-8 hours. Get help right away if:  You have symptoms of severe dehydration.  You cannot drink fluids without vomiting.  Your symptoms get worse with treatment.  You have a fever.  You have a severe headache.  You have vomiting or diarrhea  that: ? Gets worse. ? Does not go away.  You have blood or green matter (bile) in your vomit.  You have blood in your stool. This may cause stool to look black and tarry.  You have not urinated in 6-8 hours.  You faint.  Your heart rate while sitting still is over 100 beats a minute.  You have trouble breathing. This information is not intended to replace advice given to you by your health care provider. Make sure you discuss any questions you have with your health care provider. Document Released: 10/06/2005 Document Revised: 05/02/2016 Document Reviewed: 11/30/2015 Elsevier Interactive Patient Education  2018 [employer].     Dizziness Dizziness is a common problem. It is a feeling of unsteadiness or light-headedness. You may feel like you are about to faint. Dizziness can lead to injury if you stumble or fall. Anyone can become dizzy, but dizziness is more common in older adults. This condition can be caused by a number of things, including medicines, dehydration, or illness. Follow these instructions at home: Eating and drinking  Drink enough fluid to keep your urine clear or pale yellow. This helps to keep you from becoming  dehydrated. Try to drink more clear fluids, such as water.  Do not drink alcohol.  Limit your caffeine intake if told to do so by your health care provider. Check ingredients and nutrition facts to see if a food or beverage contains caffeine.  Limit your salt (sodium) intake if told to do so by your health care provider. Check ingredients and nutrition facts to see if a food or beverage contains sodium. Activity  Avoid making quick movements. ? Rise slowly from chairs and steady yourself until you feel okay. ? In the morning, first sit up on the side of the bed. When you feel okay, stand slowly while you hold onto something until you know that your balance is fine.  If you need to stand in one place for a [place] time, move your legs often. Tighten and  relax the muscles in your legs while you are standing.  Do not drive or use heavy machinery if you feel dizzy.  Avoid bending down if you feel dizzy. Place items in your home so that they are easy for you to reach without leaning over. Lifestyle  Do not use any products that contain nicotine or tobacco, such as cigarettes and e-cigarettes. If you need help quitting, ask your health care provider.  Try to reduce your stress level by using methods such as yoga or meditation. Talk with your health care provider if you need help to manage your stress. General instructions  Watch your dizziness for any changes.  Take over-the-counter and prescription medicines only as told by your health care provider. Talk with your health care provider if you think that your dizziness is caused by a medicine that you are taking.  Tell a friend or a family member that you are feeling dizzy. If he or she notices any changes in your behavior, have this person call your health care provider.  Keep all follow-up visits as told by your health care provider. This is important. Contact a health care provider if:  Your dizziness does not go away.  Your dizziness or light-headedness gets worse.  You feel nauseous.  You have reduced hearing.  You have new symptoms.  You are unsteady on your feet or you feel like the room is spinning. Get help right away if:  You vomit or have diarrhea and are unable to eat or drink anything.  You have problems talking, walking, swallowing, or using your arms, hands, or legs.  You feel generally weak.  You are not thinking clearly or you have trouble forming sentences. It may take a friend or family member to notice this.  You have chest pain, abdominal pain, shortness of breath, or sweating.  Your vision changes.  You have any bleeding.  You have a severe headache.  You have neck pain or a stiff neck.  You have a fever. These symptoms may represent a serious  problem that is an emergency. Do not wait to see if the symptoms will go away. Get medical help right away. Call your local emergency services (911 in the [place] Do not drive yourself to the hospital. Summary  Dizziness is a feeling of unsteadiness or light-headedness. This condition can be caused by a number of things, including medicines, dehydration, or illness.  Anyone can become dizzy, but dizziness is more common in older adults.  Drink enough fluid to keep your urine clear or pale yellow. Do not drink alcohol.  Avoid making quick movements if you feel dizzy. Monitor your dizziness for any  changes. This information is not intended to replace advice given to you by your health care provider. Make sure you discuss any questions you have with your health care provider. Document Released: 04/01/2001 Document Revised: 11/08/2016 Document Reviewed: 11/08/2016 Elsevier Interactive Patient Education  [employer]2018 Elsevier Inc.

## 2018-03-23 NOTE — Progress Notes (Signed)
MRN: [id number] DOB: April 05, 1991  Subjective:   Abigail Lowe is a 27 y.o. female presenting for acute onset of dizziness with near syncope, associated nausea without vomiting, heart racing and palpitations.  Patient cannot recall any inciting factors.  Denies confusion, headache, chest pain, shortness of breath, wheezing, belly pain, dysuria, urinary frequency.  Patient has a history of anxiety and is being managed with Wellbutrin and Zoloft.  She has been on these medications for the past 2 months and admits that she has had episodes such as this before.  She does not hydrate regularly, has not hydrated well today specifically. Denies smoking cigarettes or drinking alcohol.   Abigail Lowe has a current medication list which includes the following prescription(s): bupropion and sertraline. Also has No Known Allergies.  Abigail Lowe  has a past medical history of Anxiety, Depression, Gestational diabetes, and Strep pharyngitis (12/15/2017). Also  has a past surgical history that includes Intrauterine device (iud) insertion and Cholecystectomy (N/A, 11/25/2016).  Objective:   Vitals: BP (!) 153/116   Pulse (!) 116   Temp 98.2 F (36.8 C) (Oral)   Resp 18   SpO2 98%   Orthostatic VS for the past 24 hrs (Last 3 readings):  BP- Lying Pulse- Lying BP- Standing at 0 minutes Pulse- Standing at 0 minutes BP- Standing at 3 minutes Pulse- Standing at 3 minutes  03/23/18 1500 123/81 120 122/81 120 127/81 120   Physical Exam  Constitutional: She is oriented to person, place, and time. She appears well-developed and well-nourished.  HENT:  Mucous membranes dry.  Eyes: Pupils are equal, round, and reactive to light. EOM are normal. Right eye exhibits no discharge. Left eye exhibits no discharge. No scleral icterus.  Neck: Normal range of motion. Neck supple.  Cardiovascular: Normal rate, regular rhythm and intact distal pulses. Exam reveals no gallop and no friction rub.  No murmur heard. Pulmonary/Chest: No  respiratory distress. She has no wheezes. She has no rales.  Abdominal: Soft. Bowel sounds are normal. She exhibits no distension and no mass. There is no tenderness. There is no rebound and no guarding.  Lymphadenopathy:    She has no cervical adenopathy.  Neurological: She is alert and oriented to person, place, and time.  Skin: Skin is warm and dry.  Psychiatric: She has a normal mood and affect.   Results for orders placed or performed in visit on 03/23/18 (from the past 24 hour(s))  POCT urinalysis dipstick     Status: Abnormal   Collection Time: 03/23/18  2:58 PM  Result Value Ref Range   Color, UA yellow yellow   Clarity, UA hazy (A) clear   Glucose, UA negative negative mg/dL   Bilirubin, UA negative negative   Ketones, POC UA negative negative mg/dL   Spec Grav, UA 9.562 1.308 - 1.025   Blood, UA negative negative   pH, UA 7.5 5.0 - 8.0   Protein Ur, POC negative negative mg/dL   Urobilinogen, UA 0.2 0.2 or 1.0 E.U./dL   Nitrite, UA Negative Negative   Leukocytes, UA Small (1+) (A) Negative  POCT CBC     Status: Normal   Collection Time: 03/23/18  2:59 PM  Result Value Ref Range   WBC 5.0 4.6 - 10.2 K/uL   Lymph, poc 1.6 0.6 - 3.4   POC LYMPH PERCENT 32.2 10 - 50 %L   MID (cbc) 0.4 0 - 0.9   POC MID % 7.9 0 - 12 %M   POC Granulocyte 3.0 2 -  6.9   Granulocyte percent 59.9 37 - 80 %G   RBC 5.16 4.04 - 5.48 M/uL   Hemoglobin 14.4 12.2 - 16.2 g/dL   HCT, POC 64.444.2 03.437.7 - 47.9 %   MCV 85.7 80 - 97 fL   MCH, POC 28.0 27 - 31.2 pg   MCHC 32.7 31.8 - 35.4 g/dL   RDW, POC 74.212.7 %   Platelet Count, POC 293 142 - 424 K/uL   MPV 7.7 0 - 99.8 fL  POCT urine pregnancy     Status: None   Collection Time: 03/23/18  3:35 PM  Result Value Ref Range   Preg Test, Ur Negative Negative   ECG interpretation - Sinus tachycardia at 102 bpm with right axis deviation.  Largely comparable to ECG from 11/24/2016.  Assessment and Plan :   Dizziness - Plan: POCT CBC, POCT urinalysis  dipstick, Basic metabolic panel, Orthostatic vital signs, EKG 12-Lead, POCT urine pregnancy  Near syncope  Nausea without vomiting - Plan: Urine Culture, ondansetron (ZOFRAN) injection 4 mg, POCT urine pregnancy  Generalized anxiety disorder  Urine leukocytes - Plan: Urine Culture  Dehydration - Plan: Care order/instruction:, 0.9 %  sodium chloride infusion  Labs pending, will manage patient for dehydration.  Counseled on need for adequate hydration daily.  She is to maintain her medications.  Will offer Zofran for nausea in case this persists.  Patient is to follow-up with her PCP.  Wallis BambergMario Spero Gunnels, [name] Primary Care at [facility] [phone] 03/23/2018  2:45 PM

## 2018-03-24 LAB — BASIC METABOLIC PANEL
BUN/Creatinine Ratio: 12 (ref 9–23)
BUN: 9 mg/dL (ref 6–20)
CO2: 21 mmol/L (ref 20–29)
Calcium: 9.8 mg/dL (ref 8.7–10.2)
Chloride: 99 mmol/L (ref 96–106)
Creatinine, Ser: 0.74 mg/dL (ref 0.57–1.00)
GFR calc Af Amer: 129 mL/min/{1.73_m2} (ref >59)
GFR calc non Af Amer: 112 mL/min/{1.73_m2} (ref >59)
Glucose: 86 mg/dL (ref 65–99)
Potassium: 4.3 mmol/L (ref 3.5–5.2)
Sodium: 138 mmol/L (ref 134–144)

## 2018-03-25 LAB — URINE CULTURE

## 2018-05-19 ENCOUNTER — Other Ambulatory Visit: Payer: Self-pay

## 2018-05-19 ENCOUNTER — Ambulatory Visit: Payer: 59 | Admitting: Physician Assistant

## 2018-05-19 ENCOUNTER — Encounter: Payer: Self-pay | Admitting: Physician Assistant

## 2018-05-19 VITALS — BP 108/60 | HR 93 | Temp 98.6°F | Resp 18 | Ht 62.5 in | Wt 150.6 lb

## 2018-05-19 DIAGNOSIS — F410 Panic disorder [episodic paroxysmal anxiety] without agoraphobia: Secondary | ICD-10-CM | POA: Diagnosis not present

## 2018-05-19 DIAGNOSIS — G479 Sleep disorder, unspecified: Secondary | ICD-10-CM

## 2018-05-19 DIAGNOSIS — F411 Generalized anxiety disorder: Secondary | ICD-10-CM | POA: Diagnosis not present

## 2018-05-19 MED ORDER — BUSPIRONE HCL 10 MG PO TABS: 5.0000 mg | ORAL_TABLET | Freq: Three times a day (TID) | ORAL | 0 refills | Status: DC

## 2018-05-19 MED FILL — busPIRone HCL 10 MG TABS: 10 | 30 days supply | Qty: 90 | Fill #0

## 2018-05-19 NOTE — Progress Notes (Signed)
Abigail Lowe  MRN: [id number] DOB: Feb 23, 1991  PCP: Porfirio OarJeffery, [name], [name]  Chief Complaint  Patient presents with  . mood    follow up     Subjective:  Pt presents to clinic for medication follow-up.  wellbutrin - slight decrease in appetite - with the addition of this more energy but also some increase in her anxiety With increase with zoloft - slight nausea so she takes it at night - with the increase she has not noticed much change in her mood  Anxiety - fidgety, nervous in crowds, gets physical symptoms, work changes -this continues to be the majority of her problem  Therapy -has never done this  History is obtained by patient.  Review of Systems  Constitutional: Negative for chills and fever.  Psychiatric/Behavioral: The patient is nervous/anxious.     Patient Active Problem List   Diagnosis Date Noted  . Depression 12/18/2017  . Generalized anxiety disorder 12/18/2017    Current Outpatient Medications on File Prior to Visit  Medication Sig Dispense Refill  . buPROPion (WELLBUTRIN XL) 300 MG 24 hr tablet Take 1 tablet (300 mg total) by mouth daily. 90 tablet 3  . sertraline (ZOLOFT) 100 MG tablet Take 1 tablet (100 mg total) by mouth daily. 90 tablet 3  . ondansetron (ZOFRAN-ODT) 8 MG disintegrating tablet Take 1 tablet (8 mg total) by mouth every 8 (eight) hours as needed for nausea. (Patient not taking: Reported on 05/19/2018) 15 tablet 0   No current facility-administered medications on file prior to visit.     No Known Allergies  Past Medical History:  Diagnosis Date  . Anxiety   . Depression   . Gestational diabetes   . Strep pharyngitis 12/15/2017   Social History   Social History Narrative   Live with husband Verdon CumminsJesse and 2 children in [place].    Social History   Tobacco Use  . Smoking status: Never Smoker  . Smokeless tobacco: Never Used  Substance Use Topics  . Alcohol use: No  . Drug use: No   family history includes ADD / ADHD in her  brother; Cancer in her maternal grandfather and paternal grandfather; Cervical cancer in her maternal grandmother and mother; Diabetes in her paternal grandfather; Heart disease in her maternal grandfather; Hyperlipidemia in her father and paternal grandfather; Hypertension in her father and maternal grandmother.     Objective:  BP 108/60   Pulse 93   Temp 98.6 F (37 C) (Oral)   Resp 18   Ht 5' 2.5" (1.588 m)   Wt 150 lb 9.6 oz (68.3 kg)   LMP 05/02/2018   SpO2 96%   BMI 27.11 kg/m  Body mass index is 27.11 kg/m.  Wt Readings from Last 3 Encounters:  05/19/18 150 lb 9.6 oz (68.3 kg)  03/19/18 150 lb 6.4 oz (68.2 kg)  02/08/18 154 lb 9.6 oz (70.1 kg)    Physical Exam  Constitutional: She is oriented to person, place, and time. She appears well-developed and well-nourished.  HENT:  Head: Normocephalic and atraumatic.  Right Ear: Hearing and external ear normal.  Left Ear: Hearing and external ear normal.  Eyes: Conjunctivae are normal.  Neck: Normal range of motion.  Pulmonary/Chest: Effort normal.  Neurological: She is alert and oriented to person, place, and time.  Skin: Skin is warm, dry and intact.  Psychiatric: She has a normal mood and affect. Her behavior is normal. Judgment and thought content normal.  Vitals reviewed.   Assessment and Plan :  Generalized anxiety disorder - Plan: busPIRone (BUSPAR) 10 MG tablet  Sleep disturbance  Panic attacks  Discussed with patient other options for medications, she does not want to change at this time from the Zoloft and the Wellbutrin.  We will add BuSpar to see if that will help with her generalized anxiety but is still quite present.  Discussed with her how to start BuSpar at 5 mg and she can titrate up to 10 mg 3 times daily.  She will follow-up with me in 1 month.  Think she might be a good candidate for Lexapro possibly due to side effects of Zoloft and perhaps even Pristiq.  Patient verbalized to me that they  understand the following: diagnosis, what is being done for them, what to expect and what should be done at home.  Their questions have been answered.  See after visit summary for patient specific instructions.  Benny Lennert [name]  Primary Care at [facility] 05/22/2018 6:29 PM  Please note: Portions of this report may have been transcribed using dragon voice recognition software. Every effort was made to ensure accuracy; however, inadvertent computerized transcription errors may be present.

## 2018-05-19 NOTE — Patient Instructions (Addendum)
Protein 90-120 myfitnesspal -- APP  1500-1800 calories a day   Smiling minds app for mediation Melatonin - for sleep start  buspar - start with 1/2pll in the am and early afternoon - you can increase up to 1 full pill 3x/day  IF you received an x-ray today, you will receive an invoice from [facility]. Please contact [facility] at [phone] with questions or concerns regarding your invoice.   IF you received labwork today, you will receive an invoice from [facility]. Please contact [facility] at [phone] with questions or concerns regarding your invoice.   Our billing staff will not be able to assist you with questions regarding bills from these companies.  You will be contacted with the lab results as soon as they are available. The fastest way to get your results is to activate your My Chart account. Instructions are located on the last page of this paperwork. If you have not heard from us regarding the results in 2 weeks, please contact this office.

## 2018-05-22 ENCOUNTER — Encounter: Payer: Self-pay | Admitting: Physician Assistant

## 2018-05-26 MED FILL — SERTRALINE HCL 100 MG TAB: 100 | 90 days supply | Qty: 90 | Fill #0

## 2018-05-26 MED FILL — BUPROPION HCL XL 300 MG TAB: 300 | 90 days supply | Qty: 90 | Fill #1

## 2018-06-17 ENCOUNTER — Ambulatory Visit: Payer: 59 | Admitting: Physician Assistant

## 2018-06-17 ENCOUNTER — Encounter: Payer: Self-pay | Admitting: Physician Assistant

## 2018-06-17 ENCOUNTER — Other Ambulatory Visit: Payer: Self-pay

## 2018-06-17 VITALS — BP 102/70 | HR 92 | Temp 98.1°F | Resp 18 | Ht 62.5 in | Wt 150.2 lb

## 2018-06-17 DIAGNOSIS — F411 Generalized anxiety disorder: Secondary | ICD-10-CM

## 2018-06-17 MED ORDER — ESCITALOPRAM OXALATE 10 MG PO TABS
10.0000 mg | ORAL_TABLET | Freq: Every day | ORAL | 1 refills | Status: DC
Start: 2018-06-17 — End: 2019-12-07

## 2018-06-17 MED FILL — ESCITALOPRAM 10 MG TABLET: 10 | 30 days supply | Qty: 30 | Fill #0

## 2018-06-17 NOTE — Progress Notes (Signed)
Abigail Lowe  MRN: [id number] DOB: Nov 14, 1990  PCP: [name], [name], [name]  Chief Complaint  Patient presents with  . Anxiety    follow up     Subjective:  Pt presents to clinic for medication recheck.  She has not gotten any benefit from the BuSpar.  She has been on it for about a month.  She does think she would like to stop the Zoloft as she does not think it is helping that it is giving side effects.  She is interested in starting on Lexapro.  She is gotten pretty to help with her anxiety and that does help distract her mind slightly.  History is obtained by patient.  Review of Systems  Constitutional: Negative for chills and fever.  Psychiatric/Behavioral: Negative for sleep disturbance. The patient is nervous/anxious.     Patient Active Problem List   Diagnosis Date Noted  . Depression 12/18/2017  . Generalized anxiety disorder 12/18/2017    Current Outpatient Medications on File Prior to Visit  Medication Sig Dispense Refill  . buPROPion (WELLBUTRIN XL) 300 MG 24 hr tablet Take 1 tablet (300 mg total) by mouth daily. 90 tablet 3   No current facility-administered medications on file prior to visit.     No Known Allergies  Past Medical History:  Diagnosis Date  . Anxiety   . Depression   . Gestational diabetes   . Strep pharyngitis 12/15/2017   Social History   Social History Narrative   Live with husband Verdon CumminsJesse and 2 children [place].    Social History   Tobacco Use  . Smoking status: Never Smoker  . Smokeless tobacco: Never Used  Substance Use Topics  . Alcohol use: No  . Drug use: No   family history includes ADD / ADHD in her brother; Cancer in her maternal grandfather and paternal grandfather; Cervical cancer in her maternal grandmother and mother; Diabetes in her paternal grandfather; Heart disease in her maternal grandfather; Hyperlipidemia in her father and paternal grandfather; Hypertension in her father and maternal grandmother.       Objective:  BP 102/70   Pulse 92   Temp 98.1 F (36.7 C) (Oral)   Resp 18   Ht 5' 2.5" (1.588 m)   Wt 150 lb 3.2 oz (68.1 kg)   LMP 06/03/2018   SpO2 98%   BMI 27.03 kg/m  Body mass index is 27.03 kg/m.  Wt Readings from Last 3 Encounters:  06/17/18 150 lb 3.2 oz (68.1 kg)  05/19/18 150 lb 9.6 oz (68.3 kg)  03/19/18 150 lb 6.4 oz (68.2 kg)    Physical Exam  Constitutional: She is oriented to person, place, and time. She appears well-developed and well-nourished.  Using putty and room to keep hands busy  HENT:  Head: Normocephalic and atraumatic.  Right Ear: Hearing and external ear normal.  Left Ear: Hearing and external ear normal.  Eyes: Conjunctivae are normal.  Neck: Normal range of motion.  Pulmonary/Chest: Effort normal.  Neurological: She is alert and oriented to person, place, and time.  Skin: Skin is warm, dry and intact.  Psychiatric: She has a normal mood and affect. Her behavior is normal. Judgment and thought content normal.  Vitals reviewed.   Assessment and Plan :  Generalized anxiety disorder - Plan: escitalopram (LEXAPRO) 10 MG tablet   Stop BuSpar.  Stop Zoloft and switch to Lexapro.  Recommended patient take at night to decrease side effects.  Contact me in 2 weeks regarding side effects,  follow-up by my chart in 4 weeks to see if there is any improvement and may increase dose at that time.  Otherwise office visit in 6 weeks.  Patient verbalized to me that they understand the following: diagnosis, what is being done for them, what to expect and what should be done at home.  Their questions have been answered.  See after visit summary for patient specific instructions.  Benny Lennert [name]  Primary [facility] at [facility] 06/17/2018 4:49 PM  Please note: Portions of this report may have been transcribed using dragon voice recognition software. Every effort was made to ensure accuracy; however, inadvertent computerized transcription  errors may be present.

## 2018-06-17 NOTE — Patient Instructions (Signed)
° ° ° °  If you have lab work done today you will be contacted with your lab results within the next 2 weeks.  If you have not heard from us then please contact us. The fastest way to get your results is to register for My Chart. ° ° °IF you received an x-ray today, you will receive an invoice from [facility]. Please contact [facility] at [phone] with questions or concerns regarding your invoice.  ° °IF you received labwork today, you will receive an invoice from [facility]. Please contact [facility] at [phone] with questions or concerns regarding your invoice.  ° °Our billing staff will not be able to assist you with questions regarding bills from these companies. ° °You will be contacted with the lab results as soon as they are available. The fastest way to get your results is to activate your My Chart account. Instructions are located on the last page of this paperwork. If you have not heard from us regarding the results in 2 weeks, please contact this office. °  ° ° ° °

## 2018-10-29 DIAGNOSIS — N912 Amenorrhea, unspecified: Secondary | ICD-10-CM | POA: Diagnosis not present

## 2018-10-29 DIAGNOSIS — F411 Generalized anxiety disorder: Secondary | ICD-10-CM | POA: Diagnosis not present

## 2018-10-29 DIAGNOSIS — F320 Major depressive disorder, single episode, mild: Secondary | ICD-10-CM | POA: Diagnosis not present

## 2018-10-29 DIAGNOSIS — Z3A01 Less than 8 weeks gestation of pregnancy: Secondary | ICD-10-CM | POA: Diagnosis not present

## 2018-11-07 DIAGNOSIS — O418X10 Other specified disorders of amniotic fluid and membranes, first trimester, not applicable or unspecified: Secondary | ICD-10-CM | POA: Diagnosis not present

## 2018-11-07 DIAGNOSIS — O200 Threatened abortion: Secondary | ICD-10-CM | POA: Diagnosis not present

## 2018-11-07 DIAGNOSIS — Z3A01 Less than 8 weeks gestation of pregnancy: Secondary | ICD-10-CM | POA: Diagnosis not present

## 2018-11-07 DIAGNOSIS — O209 Hemorrhage in early pregnancy, unspecified: Secondary | ICD-10-CM | POA: Diagnosis not present

## 2018-11-11 DIAGNOSIS — N911 Secondary amenorrhea: Secondary | ICD-10-CM | POA: Diagnosis not present

## 2018-11-11 DIAGNOSIS — O200 Threatened abortion: Secondary | ICD-10-CM | POA: Diagnosis not present

## 2018-11-15 DIAGNOSIS — N911 Secondary amenorrhea: Secondary | ICD-10-CM | POA: Diagnosis not present

## 2018-11-16 DIAGNOSIS — Z31430 Encounter of female for testing for genetic disease carrier status for procreative management: Secondary | ICD-10-CM | POA: Diagnosis not present

## 2018-11-16 DIAGNOSIS — Z3481 Encounter for supervision of other normal pregnancy, first trimester: Secondary | ICD-10-CM | POA: Diagnosis not present

## 2018-11-16 DIAGNOSIS — Z3685 Encounter for antenatal screening for Streptococcus B: Secondary | ICD-10-CM | POA: Diagnosis not present

## 2018-11-20 HISTORY — PX: DILATION AND CURETTAGE OF UTERUS: SHX78

## 2018-11-22 DIAGNOSIS — Z3A09 9 weeks gestation of pregnancy: Secondary | ICD-10-CM | POA: Diagnosis not present

## 2018-11-22 DIAGNOSIS — Z3401 Encounter for supervision of normal first pregnancy, first trimester: Secondary | ICD-10-CM | POA: Diagnosis not present

## 2018-11-22 DIAGNOSIS — Z3481 Encounter for supervision of other normal pregnancy, first trimester: Secondary | ICD-10-CM | POA: Diagnosis not present

## 2018-11-22 DIAGNOSIS — O200 Threatened abortion: Secondary | ICD-10-CM | POA: Diagnosis not present

## 2018-11-22 DIAGNOSIS — O368310 Maternal care for abnormalities of the fetal heart rate or rhythm, first trimester, not applicable or unspecified: Secondary | ICD-10-CM | POA: Diagnosis not present

## 2018-11-30 DIAGNOSIS — O021 Missed abortion: Secondary | ICD-10-CM | POA: Diagnosis not present

## 2018-11-30 DIAGNOSIS — Z113 Encounter for screening for infections with a predominantly sexual mode of transmission: Secondary | ICD-10-CM | POA: Diagnosis not present

## 2018-12-01 ENCOUNTER — Other Ambulatory Visit: Payer: Self-pay | Admitting: Obstetrics and Gynecology

## 2018-12-01 DIAGNOSIS — O021 Missed abortion: Secondary | ICD-10-CM | POA: Diagnosis not present

## 2019-01-27 DIAGNOSIS — Z566 Other physical and mental strain related to work: Secondary | ICD-10-CM | POA: Diagnosis not present

## 2019-01-27 DIAGNOSIS — F411 Generalized anxiety disorder: Secondary | ICD-10-CM | POA: Diagnosis not present

## 2019-01-27 MED FILL — clonazePAM 0.5 MG TABS: 0.5 | 30 days supply | Qty: 30 | Fill #0

## 2019-02-21 DIAGNOSIS — Z566 Other physical and mental strain related to work: Secondary | ICD-10-CM | POA: Diagnosis not present

## 2019-02-21 DIAGNOSIS — F411 Generalized anxiety disorder: Secondary | ICD-10-CM | POA: Diagnosis not present

## 2019-02-21 MED FILL — LORazepam 0.5 MG TABS: 0.5 | 15 days supply | Qty: 30 | Fill #0

## 2019-03-11 DIAGNOSIS — F411 Generalized anxiety disorder: Secondary | ICD-10-CM | POA: Diagnosis not present

## 2019-03-11 MED FILL — buPROPion HCL ER (XL) 150 M: 150 | 30 days supply | Qty: 30 | Fill #0

## 2019-05-09 DIAGNOSIS — Z3482 Encounter for supervision of other normal pregnancy, second trimester: Secondary | ICD-10-CM | POA: Diagnosis not present

## 2019-05-09 DIAGNOSIS — Z3483 Encounter for supervision of other normal pregnancy, third trimester: Secondary | ICD-10-CM | POA: Diagnosis not present

## 2019-05-26 DIAGNOSIS — F411 Generalized anxiety disorder: Secondary | ICD-10-CM | POA: Diagnosis not present

## 2019-05-26 DIAGNOSIS — Z0000 Encounter for general adult medical examination without abnormal findings: Secondary | ICD-10-CM | POA: Diagnosis not present

## 2019-05-26 MED FILL — buPROPion HCL ER (XL) 150 M: 150 | 30 days supply | Qty: 60 | Fill #0

## 2019-06-07 DIAGNOSIS — H5213 Myopia, bilateral: Secondary | ICD-10-CM | POA: Diagnosis not present

## 2019-06-16 DIAGNOSIS — L02415 Cutaneous abscess of right lower limb: Secondary | ICD-10-CM | POA: Diagnosis not present

## 2019-06-16 DIAGNOSIS — R7989 Other specified abnormal findings of blood chemistry: Secondary | ICD-10-CM | POA: Diagnosis not present

## 2019-06-16 MED FILL — LEVOTHYROXINE 25 MCG TABLET: 25 | 90 days supply | Qty: 90 | Fill #0

## 2019-06-16 MED FILL — DOXYCYCLINE HYCLATE 100 MG: 100 | 20 days supply | Qty: 20 | Fill #0

## 2019-07-21 MED FILL — buPROPion HCL ER (XL) 150 M: 150 | 30 days supply | Qty: 60 | Fill #1

## 2019-08-29 DIAGNOSIS — F411 Generalized anxiety disorder: Secondary | ICD-10-CM | POA: Diagnosis not present

## 2019-08-29 DIAGNOSIS — R7989 Other specified abnormal findings of blood chemistry: Secondary | ICD-10-CM | POA: Diagnosis not present

## 2019-08-29 DIAGNOSIS — F320 Major depressive disorder, single episode, mild: Secondary | ICD-10-CM | POA: Diagnosis not present

## 2019-09-01 DIAGNOSIS — R7989 Other specified abnormal findings of blood chemistry: Secondary | ICD-10-CM | POA: Diagnosis not present

## 2019-10-21 NOTE — L&D Delivery Note (Signed)
Delivery Note At 4:55 PM a viable female was delivered via Vaginal, Spontaneous (Presentation: Right Occiput Anterior).  APGAR: 8, 8; weight pending.   Placenta status: Spontaneous, Intact.  Cord: 3 vessels with the following complications: None.  Cord pH: n/a  Anesthesia: Epidural Episiotomy: None Lacerations: 2nd degree Suture Repair: 3.0 vicryl Qnt. Blood Loss (mL): 550  Mom to postpartum.  Baby to Couplet care / Skin to Skin.  Called to see patient.  Mom pushed to deliver a viable female infant.  The head followed by shoulders, which delivered without difficulty, and the rest of the body.  A double nuchal cord noted and reduced.  Baby to mom's chest.  Cord clamped and cut after > 1 min delay.  Cord blood obtained.  Placenta delivered spontaneously, intact, with a 3-vessel cord.  Second degree perineal laceration repaired with 3-0 Vicryl in standard fashion.  All counts correct.  Hemostasis obtained with IV pitocin and fundal massage. QBL 550 mL.     Thomasene Mohair, MD 07/17/2020, 5:18 PM

## 2019-11-24 DIAGNOSIS — E039 Hypothyroidism, unspecified: Secondary | ICD-10-CM | POA: Diagnosis not present

## 2019-11-24 DIAGNOSIS — F320 Major depressive disorder, single episode, mild: Secondary | ICD-10-CM | POA: Diagnosis not present

## 2019-11-24 DIAGNOSIS — Z3A01 Less than 8 weeks gestation of pregnancy: Secondary | ICD-10-CM | POA: Diagnosis not present

## 2019-11-30 ENCOUNTER — Ambulatory Visit (INDEPENDENT_AMBULATORY_CARE_PROVIDER_SITE_OTHER): Payer: 59 | Admitting: Obstetrics and Gynecology

## 2019-11-30 ENCOUNTER — Other Ambulatory Visit: Payer: Self-pay

## 2019-11-30 ENCOUNTER — Other Ambulatory Visit (HOSPITAL_COMMUNITY)
Admission: RE | Admit: 2019-11-30 | Discharge: 2019-11-30 | Disposition: A | Discharge status: Home / Self Care | Payer: 59 | Source: Ambulatory Visit | Attending: Obstetrics and Gynecology | Admitting: Obstetrics and Gynecology

## 2019-11-30 ENCOUNTER — Encounter: Payer: Self-pay | Admitting: Obstetrics and Gynecology

## 2019-11-30 VITALS — BP 132/84 | HR 112 | Wt 162.0 lb

## 2019-11-30 DIAGNOSIS — Z3480 Encounter for supervision of other normal pregnancy, unspecified trimester: Secondary | ICD-10-CM | POA: Diagnosis not present

## 2019-11-30 DIAGNOSIS — Z3A01 Less than 8 weeks gestation of pregnancy: Secondary | ICD-10-CM

## 2019-11-30 DIAGNOSIS — Z113 Encounter for screening for infections with a predominantly sexual mode of transmission: Secondary | ICD-10-CM | POA: Insufficient documentation

## 2019-11-30 DIAGNOSIS — N912 Amenorrhea, unspecified: Secondary | ICD-10-CM

## 2019-11-30 DIAGNOSIS — Z3201 Encounter for pregnancy test, result positive: Secondary | ICD-10-CM

## 2019-11-30 DIAGNOSIS — Z8632 Personal history of gestational diabetes: Secondary | ICD-10-CM | POA: Diagnosis not present

## 2019-11-30 DIAGNOSIS — O09299 Supervision of pregnancy with other poor reproductive or obstetric history, unspecified trimester: Secondary | ICD-10-CM | POA: Diagnosis not present

## 2019-11-30 DIAGNOSIS — O99280 Endocrine, nutritional and metabolic diseases complicating pregnancy, unspecified trimester: Secondary | ICD-10-CM

## 2019-11-30 DIAGNOSIS — Z3689 Encounter for other specified antenatal screening: Secondary | ICD-10-CM

## 2019-11-30 DIAGNOSIS — E039 Hypothyroidism, unspecified: Secondary | ICD-10-CM | POA: Insufficient documentation

## 2019-11-30 DIAGNOSIS — Z124 Encounter for screening for malignant neoplasm of cervix: Secondary | ICD-10-CM | POA: Insufficient documentation

## 2019-11-30 LAB — POCT URINE PREGNANCY: Preg Test, Ur: POSITIVE — AB

## 2019-11-30 NOTE — Progress Notes (Signed)
New Obstetric Patient H&P    Chief Complaint: "Desires prenatal care"   History of Present Illness: Patient is a 29 y.o. [id number] Not Hispanic or Latino female, presents with amenorrhea and positive home pregnancy test. Patient's last menstrual period was 10/12/2019 (exact date). and based on her  LMP, her EDD is Estimated Date of Delivery: 07/18/20 and her EGA is [redacted]w[redacted]d. .    Since her LMP she claims she has experienced some nausea, fatigue, breast tenderness. She denies vaginal bleeding. Her past medical history is contibutory for anxiety. Her prior pregnancies are notable for gestational DM  Since her LMP, she admits to the use of tobacco products  no There are cats in the home in the home  yes If yes Outdoor She admits close contact with children on a regular basis  yes  She has had chicken pox in the past no She has had Tuberculosis exposures, symptoms, or previously tested positive for TB   no Current or past history of domestic violence. no  Genetic Screening/Teratology Counseling: (Includes patient, baby's father, or anyone in either family with:)   1. Patient's age >/= 30 at [facility]  no 2. Thalassemia ([place], [place], Mediterranean, or Asian background): MCV<80  no 3. Neural tube defect (meningomyelocele, spina bifida, anencephaly)  no 4. Congenital heart defect  no  5. Down syndrome  no 6. Tay-Sachs (Jewish, [place]  no 7. Canavan's Disease  no 8. Sickle cell disease or trait (African)  no  9. Hemophilia or other blood disorders  no  10. Muscular dystrophy  no  11. Cystic fibrosis  no  12. Huntington's Chorea  no  13. Mental retardation/autism  no 14. Other inherited genetic or chromosomal disorder  no 15. Maternal metabolic disorder (DM, PKU, etc)  no 16. Patient or FOB with a child with a birth defect not listed above no  16a. Patient or FOB with a birth defect themselves no 17. Recurrent pregnancy loss, or stillbirth  no  18. Any medications since LMP other than  prenatal vitamins (include vitamins, supplements, OTC meds, drugs, alcohol)  no 19. Any other genetic/environmental exposure to discuss  no  Infection History:   1. Lives with someone with TB or TB exposed  no  2. Patient or partner has history of genital herpes  no 3. Rash or viral illness since LMP  no 4. History of STI (GC, CT, HPV, syphilis, HIV)  no 5. History of recent travel :  no  Other pertinent information:  no     Review of Systems:10 point review of systems negative unless otherwise noted in HPI  Past Medical History:  Past Medical History:  Diagnosis Date  . Anxiety   . Depression   . Gestational diabetes   . Strep pharyngitis 12/15/2017    Past Surgical History:  Past Surgical History:  Procedure Laterality Date  . CHOLECYSTECTOMY N/A 11/25/2016   Procedure: LAPAROSCOPIC CHOLECYSTECTOMY WITH INTRAOPERATIVE CHOLANGIOGRAM;  Surgeon: Manus Rudd, MD;  Location: [facility] OR;  Service: General;  Laterality: N/A;  . DILATION AND CURETTAGE OF UTERUS  11/2018   SAB  . INTRAUTERINE DEVICE (IUD) INSERTION      Gynecologic History: Patient's last menstrual period was 10/12/2019 (exact date).  Obstetric History: [id number]  Family History:  Family History  Problem Relation Age of Onset  . Hypertension Father   . Hyperlipidemia Father   . Heart disease Maternal Grandfather   . Cancer Maternal Grandfather   . Cervical cancer Mother   . ADD /  ADHD Brother   . Hypertension Maternal Grandmother   . Cervical cancer Maternal Grandmother   . Cancer Paternal Grandfather   . Diabetes Paternal Grandfather   . Hyperlipidemia Paternal Grandfather     Social History:  Social History   Socioeconomic History  . Marital status: Married    Spouse name: [name]  . Number of children: 2  . Years of education: Not on file  . Highest education level: Not on file  Occupational History  . Not on file  Tobacco Use  . Smoking status: Never Smoker  . Smokeless tobacco: Never Used    Substance and Sexual Activity  . Alcohol use: No  . Drug use: No  . Sexual activity: Yes    Birth control/protection: None  Other Topics Concern  . Not on file  Social History Narrative   Live with husband [name] and 2 children in [place].    Social Determinants of Health   Financial Resource Strain:   . Difficulty of Paying Living Expenses: Not on file  Food Insecurity:   . Worried About Programme researcher, broadcasting/film/video in the Last Year: Not on file  . Ran Out of Food in the Last Year: Not on file  Transportation Needs:   . Lack of Transportation (Medical): Not on file  . Lack of Transportation (Non-Medical): Not on file  Physical Activity:   . Days of Exercise per Week: Not on file  . Minutes of Exercise per Session: Not on file  Stress:   . Feeling of Stress : Not on file  Social Connections:   . Frequency of Communication with Friends and Family: Not on file  . Frequency of Social Gatherings with Friends and Family: Not on file  . Attends Religious Services: Not on file  . Active Member of Clubs or Organizations: Not on file  . Attends Banker Meetings: Not on file  . Marital Status: Not on file  Intimate Partner Violence:   . Fear of Current or Ex-Partner: Not on file  . Emotionally Abused: Not on file  . Physically Abused: Not on file  . Sexually Abused: Not on file    Allergies:  No Known Allergies  Medications: Prior to Admission medications   Medication Sig Start Date End Date Taking? Authorizing Provider  levothyroxine (SYNTHROID) 25 MCG tablet Take 25 mcg by mouth daily. 10/18/19  Yes [provider]  buPROPion (WELLBUTRIN XL) 300 MG 24 hr tablet Take 1 tablet (300 mg total) by mouth daily. Patient not taking: Reported on 11/30/2019 02/08/18   Porfirio Oar, PA  escitalopram (LEXAPRO) 10 MG tablet Take 1 tablet (10 mg total) by mouth daily. Patient not taking: Reported on 11/30/2019 06/17/18   Morrell Riddle, [name]    Physical Exam Vitals:  Blood pressure 132/84, pulse (!) 112, weight 162 lb (73.5 kg), last menstrual period 10/12/2019, unknown if currently breastfeeding.  General: NAD HEENT: normocephalic, anicteric Pulmonary: No increased work of breathing, CTAB  Genitourinary:  External: Normal external female genitalia.  Normal urethral meatus, normal  Bartholin's and Skene's glands.    Vagina: Normal vaginal mucosa, no evidence of prolapse.    Cervix: Grossly normal in appearance, no bleeding  Uterus:  Non-enlarged, mobile, normal contour.  No CMT  Adnexa: ovaries non-enlarged, no adnexal masses  Rectal: deferred Extremities: no edema, erythema, or tenderness Neurologic: Grossly intact Psychiatric: mood appropriate, affect full   Assessment: 29 y.o. [id number] at [redacted]w[redacted]d presenting to initiate prenatal care  Plan: 1) Avoid alcoholic beverages.  2) Patient encouraged not to smoke.  3) Discontinue the use of all non-medicinal drugs and chemicals.  4) Take prenatal vitamins daily.  5) Nutrition, food safety (fish, cheese advisories, and high nitrite foods) and exercise discussed. 6) Hospital and practice style discussed with cross coverage system.  7) Genetic Screening, such as with 1st Trimester Screening, cell free fetal DNA, AFP testing, and Ultrasound, as well as with amniocentesis and CVS as appropriate, is discussed with patient. 8) Dating scan ordered 9) HgbA1C given history of GDM 10) Hypothyroidism - Thyroid panel ordered   Malachy Mood, MD, [place] 11/30/2019, 2:43 PM

## 2019-11-30 NOTE — Progress Notes (Signed)
NOB Nausea Hx of SAB 2020

## 2019-12-01 LAB — RPR+RH+ABO+RUB AB+AB SCR+CB...
Antibody Screen: NEGATIVE
HIV Screen 4th Generation wRfx: NONREACTIVE
Hematocrit: 39.7 % (ref 34.0–46.6)
Hemoglobin: 13.9 g/dL (ref 11.1–15.9)
Hepatitis B Surface Ag: NEGATIVE
MCH: 28.5 pg (ref 26.6–33.0)
MCHC: 35 g/dL (ref 31.5–35.7)
MCV: 81 fL (ref 79–97)
Platelets: 313 10*3/uL (ref 150–450)
RBC: 4.88 x10E6/uL (ref 3.77–5.28)
RDW: 12.6 % (ref 11.7–15.4)
RPR Ser Ql: NONREACTIVE
Rh Factor: NEGATIVE
Rubella Antibodies, IGG: 4.06 index (ref >0.99)
Varicella zoster IgG: 336 index (ref >165)
WBC: 7.4 10*3/uL (ref 3.4–10.8)

## 2019-12-01 LAB — HEMOGLOBIN A1C
Est. average glucose Bld gHb Est-mCnc: 105 mg/dL
Hgb A1c MFr Bld: 5.3 % (ref 4.8–5.6)

## 2019-12-01 LAB — THYROID PANEL WITH TSH
Free Thyroxine Index: 2.9 (ref 1.2–4.9)
T3 Uptake Ratio: 24 % (ref 24–39)
T4, Total: 11.9 ug/dL (ref 4.5–12.0)
TSH: 2.65 u[IU]/mL (ref 0.450–4.500)

## 2019-12-02 LAB — CYTOLOGY - PAP
Chlamydia: NEGATIVE
Comment: NEGATIVE
Comment: NORMAL
Diagnosis: NEGATIVE
Neisseria Gonorrhea: NEGATIVE

## 2019-12-02 LAB — URINE CULTURE

## 2019-12-05 ENCOUNTER — Other Ambulatory Visit: Payer: Self-pay | Admitting: Obstetrics and Gynecology

## 2019-12-05 MED ORDER — DOXYLAMINE-PYRIDOXINE 10-10 MG PO TBEC: DELAYED_RELEASE_TABLET | ORAL | 1 refills | Status: DC

## 2019-12-07 ENCOUNTER — Ambulatory Visit (INDEPENDENT_AMBULATORY_CARE_PROVIDER_SITE_OTHER): Payer: 59

## 2019-12-07 ENCOUNTER — Ambulatory Visit (INDEPENDENT_AMBULATORY_CARE_PROVIDER_SITE_OTHER): Payer: 59 | Admitting: Obstetrics and Gynecology

## 2019-12-07 ENCOUNTER — Encounter: Payer: Self-pay | Admitting: Obstetrics and Gynecology

## 2019-12-07 ENCOUNTER — Other Ambulatory Visit: Payer: Self-pay

## 2019-12-07 ENCOUNTER — Other Ambulatory Visit: Payer: Self-pay | Admitting: Obstetrics and Gynecology

## 2019-12-07 VITALS — BP 108/60 | Wt 162.0 lb

## 2019-12-07 DIAGNOSIS — Z3A01 Less than 8 weeks gestation of pregnancy: Secondary | ICD-10-CM

## 2019-12-07 DIAGNOSIS — O26841 Uterine size-date discrepancy, first trimester: Secondary | ICD-10-CM | POA: Diagnosis not present

## 2019-12-07 DIAGNOSIS — E039 Hypothyroidism, unspecified: Secondary | ICD-10-CM

## 2019-12-07 DIAGNOSIS — Z3480 Encounter for supervision of other normal pregnancy, unspecified trimester: Secondary | ICD-10-CM

## 2019-12-07 DIAGNOSIS — Z3689 Encounter for other specified antenatal screening: Secondary | ICD-10-CM | POA: Diagnosis not present

## 2019-12-07 DIAGNOSIS — Z8632 Personal history of gestational diabetes: Secondary | ICD-10-CM

## 2019-12-07 DIAGNOSIS — O09291 Supervision of pregnancy with other poor reproductive or obstetric history, first trimester: Secondary | ICD-10-CM

## 2019-12-07 DIAGNOSIS — O99281 Endocrine, nutritional and metabolic diseases complicating pregnancy, first trimester: Secondary | ICD-10-CM

## 2019-12-07 DIAGNOSIS — O09299 Supervision of pregnancy with other poor reproductive or obstetric history, unspecified trimester: Secondary | ICD-10-CM

## 2019-12-07 DIAGNOSIS — Z3481 Encounter for supervision of other normal pregnancy, first trimester: Secondary | ICD-10-CM

## 2019-12-07 NOTE — Progress Notes (Signed)
ROB/Dating C/o Nausea picking up meds today Denies spotting, slight cramping

## 2019-12-07 NOTE — Progress Notes (Signed)
Routine Prenatal Care Visit  Subjective  Abigail Lowe is a 29 y.o. [phone] at [redacted]w[redacted]d being seen today for ongoing prenatal care.  She is currently monitored for the following issues for this low-risk pregnancy and has Depression; Generalized anxiety disorder; Supervision of other normal pregnancy, antepartum; History of gestational diabetes in prior pregnancy, currently pregnant; and Hypothyroidism affecting pregnancy, antepartum on their problem list.  ----------------------------------------------------------------------------------- Patient reports her mood has been okay since pregnancy. She has a lot of anxiety especially around the possibility of having another miscarriage. Her last miscarriage was at 9 weeks. She has stopped caffeine and wellbutrin. [place]: 10 today Contractions: Not present. Vag. Bleeding: None.  Movement: Absent. Denies leaking of fluid.  ----------------------------------------------------------------------------------- The following portions of the patient's history were reviewed and updated as appropriate: allergies, current medications, past family history, past medical history, past social history, past surgical history and problem list. Problem list updated.   Objective  Blood pressure 108/60, weight 162 lb (73.5 kg), last menstrual period 10/12/2019, unknown if currently breastfeeding. Pregravid weight 162 lb (73.5 kg) Total Weight Gain 0 lb (0 kg) Urinalysis:      Fetal Status: Fetal Heart Rate (bpm): 140   Movement: Absent     General:  Alert, oriented and cooperative. Patient is in no acute distress.  Skin: Skin is warm and dry. No rash noted.   Cardiovascular: Normal heart rate noted  Respiratory: Normal respiratory effort, no problems with respiration noted  Abdomen: Soft, gravid, appropriate for gestational age. Pain/Pressure: Absent     Pelvic:  Cervical exam deferred        Extremities: Normal range of motion.  Edema: None  Mental Status:  Normal mood and affect. Normal behavior. Normal judgment and thought content.     Assessment   29 y.o. [id number] at [redacted]w[redacted]d by  07/24/2020, by Ultrasound presenting for routine prenatal visit  Plan   Pregnancy#4 Problems (from 10/12/19 to present)    Problem Noted Resolved   Supervision of other normal pregnancy, antepartum 11/30/2019 by Malachy Mood, MD No   Overview Addendum 12/07/2019 12:00 PM by Homero Fellers, MD     Nursing Staff Provider  Office Location  [facility] Dating   7 wk [place]  Language  English Anatomy US    Flu Vaccine  07/2019 Genetic Screen  NIPS:   AFP:   First Screen:    TDaP vaccine    Hgb A1C or  GTT Early hgbA1c: normal Third trimester :   Rhogam   [ ]  28wks   LAB RESULTS   Feeding Plan  Blood Type A/Negative/-- (02/10 1601)   Contraception  Antibody Negative (02/10 1601)  Circumcision  Rubella 4.06 (02/10 1601)  Pediatrician   RPR Non Reactive (02/10 1601)   Support Person  HBsAg Negative (02/10 1601)   Prenatal Classes  HIV Non Reactive (02/10 1601)    Varicella immune  BTL Consent  GBS  (For PCN allergy, check sensitivities)        VBAC Consent  Pap  NIL 2021    Hgb Electro      CF      SMA               History of gestational diabetes in prior pregnancy, currently pregnant 11/30/2019 by Malachy Mood, MD No   Hypothyroidism affecting pregnancy, antepartum 11/30/2019 by Malachy Mood, MD No       Gestational age appropriate obstetric precautions including but not limited to vaginal bleeding, contractions, leaking of  fluid and fetal movement were reviewed in detail with the patient.    Declines medical management of anxiety/depression at this time Would like to have weekly visit in the first trimester for reassurance given her history of receurrent pregnancy loss.   Return in about 1 week (around 12/14/2019) for ROB in person with Sharhonda Atwood or [place].  Natale Milch MD [facility], [facility] 12/07/2019,  11:58 AM

## 2019-12-16 ENCOUNTER — Other Ambulatory Visit: Payer: Self-pay

## 2019-12-16 ENCOUNTER — Ambulatory Visit (INDEPENDENT_AMBULATORY_CARE_PROVIDER_SITE_OTHER): Payer: 59 | Admitting: Obstetrics and Gynecology

## 2019-12-16 VITALS — BP 106/74 | Wt 162.0 lb

## 2019-12-16 DIAGNOSIS — O99281 Endocrine, nutritional and metabolic diseases complicating pregnancy, first trimester: Secondary | ICD-10-CM

## 2019-12-16 DIAGNOSIS — O09299 Supervision of pregnancy with other poor reproductive or obstetric history, unspecified trimester: Secondary | ICD-10-CM

## 2019-12-16 DIAGNOSIS — E039 Hypothyroidism, unspecified: Secondary | ICD-10-CM

## 2019-12-16 DIAGNOSIS — F411 Generalized anxiety disorder: Secondary | ICD-10-CM

## 2019-12-16 DIAGNOSIS — O99280 Endocrine, nutritional and metabolic diseases complicating pregnancy, unspecified trimester: Secondary | ICD-10-CM

## 2019-12-16 DIAGNOSIS — O09291 Supervision of pregnancy with other poor reproductive or obstetric history, first trimester: Secondary | ICD-10-CM

## 2019-12-16 DIAGNOSIS — Z3A08 8 weeks gestation of pregnancy: Secondary | ICD-10-CM

## 2019-12-16 DIAGNOSIS — Z8632 Personal history of gestational diabetes: Secondary | ICD-10-CM

## 2019-12-16 DIAGNOSIS — Z3480 Encounter for supervision of other normal pregnancy, unspecified trimester: Secondary | ICD-10-CM

## 2019-12-16 DIAGNOSIS — O99341 Other mental disorders complicating pregnancy, first trimester: Secondary | ICD-10-CM

## 2019-12-16 NOTE — Progress Notes (Signed)
Routine Prenatal Care Visit  Subjective  Abigail Lowe is a 29 y.o. [phone] at [redacted]w[redacted]d being seen today for ongoing prenatal care.  She is currently monitored for the following issues for this low-risk pregnancy and has Depression; Generalized anxiety disorder; Supervision of other normal pregnancy, antepartum; History of gestational diabetes in prior pregnancy, currently pregnant; and Hypothyroidism affecting pregnancy, antepartum on their problem list.  ----------------------------------------------------------------------------------- Patient reports no complaints.   Contractions: Not present. Vag. Bleeding: None.  Movement: Absent. Denies leaking of fluid.  ----------------------------------------------------------------------------------- The following portions of the patient's history were reviewed and updated as appropriate: allergies, current medications, past family history, past medical history, past social history, past surgical history and problem list. Problem list updated.   Objective  Blood pressure 106/74, weight 162 lb (73.5 kg), last menstrual period 10/12/2019, unknown if currently breastfeeding. Pregravid weight 162 lb (73.5 kg) Total Weight Gain 0 lb (0 kg) Urinalysis:      Fetal Status: Fetal Heart Rate (bpm): 163   Movement: Absent     General:  Alert, oriented and cooperative. Patient is in no acute distress.  Skin: Skin is warm and dry. No rash noted.   Cardiovascular: Normal heart rate noted  Respiratory: Normal respiratory effort, no problems with respiration noted  Abdomen: Soft, gravid, appropriate for gestational age. Pain/Pressure: Absent     Pelvic:  Cervical exam deferred        Extremities: Normal range of motion.     ental Status: Normal mood and affect. Normal behavior. Normal judgment and thought content.     Assessment   29 y.o. [id number] at [redacted]w[redacted]d by  07/24/2020, by Ultrasound presenting for routine prenatal visit  Plan   Pregnancy#4 Problems  (from 10/12/19 to present)    Problem Noted Resolved   Supervision of other normal pregnancy, antepartum 11/30/2019 by Malachy Mood, MD No   Overview Addendum 12/07/2019 12:00 PM by Homero Fellers, MD     Nursing Staff Provider  Office Location  [facility] Dating   7 wk [place]  Language  English Anatomy US    Flu Vaccine  07/2019 Genetic Screen  NIPS:   AFP:   First Screen:    TDaP vaccine    Hgb A1C or  GTT Early hgbA1c: normal Third trimester :   Rhogam   [ ]  28wks   LAB RESULTS   Feeding Plan  Blood Type A/Negative/-- (02/10 1601)   Contraception  Antibody Negative (02/10 1601)  Circumcision  Rubella 4.06 (02/10 1601)  Pediatrician   RPR Non Reactive (02/10 1601)   Support Person  HBsAg Negative (02/10 1601)   Prenatal Classes  HIV Non Reactive (02/10 1601)    Varicella immune  BTL Consent  GBS  (For PCN allergy, check sensitivities)        VBAC Consent  Pap  NIL 2021    Hgb Electro      CF      SMA               History of gestational diabetes in prior pregnancy, currently pregnant 11/30/2019 by Malachy Mood, MD No   Hypothyroidism affecting pregnancy, antepartum 11/30/2019 by Malachy Mood, MD No       Gestational age appropriate obstetric precautions including but not limited to vaginal bleeding, contractions, leaking of fluid and fetal movement were reviewed in detail with the patient.    Return in about 1 week (around 12/23/2019) for ROB.  Malachy Mood, MD, [facility], [facility] 12/16/2019, 4:26 PM

## 2019-12-16 NOTE — Progress Notes (Signed)
ROB

## 2019-12-22 ENCOUNTER — Ambulatory Visit (INDEPENDENT_AMBULATORY_CARE_PROVIDER_SITE_OTHER): Payer: 59 | Admitting: Obstetrics and Gynecology

## 2019-12-22 ENCOUNTER — Encounter: Payer: Self-pay | Admitting: Obstetrics and Gynecology

## 2019-12-22 ENCOUNTER — Other Ambulatory Visit: Payer: Self-pay

## 2019-12-22 VITALS — BP 114/70 | Wt 164.0 lb

## 2019-12-22 DIAGNOSIS — O09291 Supervision of pregnancy with other poor reproductive or obstetric history, first trimester: Secondary | ICD-10-CM

## 2019-12-22 DIAGNOSIS — F411 Generalized anxiety disorder: Secondary | ICD-10-CM

## 2019-12-22 DIAGNOSIS — Z3A09 9 weeks gestation of pregnancy: Secondary | ICD-10-CM

## 2019-12-22 DIAGNOSIS — Z3480 Encounter for supervision of other normal pregnancy, unspecified trimester: Secondary | ICD-10-CM

## 2019-12-22 DIAGNOSIS — O99281 Endocrine, nutritional and metabolic diseases complicating pregnancy, first trimester: Secondary | ICD-10-CM

## 2019-12-22 DIAGNOSIS — E039 Hypothyroidism, unspecified: Secondary | ICD-10-CM

## 2019-12-22 DIAGNOSIS — O09299 Supervision of pregnancy with other poor reproductive or obstetric history, unspecified trimester: Secondary | ICD-10-CM

## 2019-12-22 DIAGNOSIS — Z8632 Personal history of gestational diabetes: Secondary | ICD-10-CM

## 2019-12-22 NOTE — Progress Notes (Signed)
Routine Prenatal Care Visit  Subjective  Abigail Lowe is a 29 y.o. [phone] at [redacted]w[redacted]d being seen today for ongoing prenatal care.  She is currently monitored for the following issues for this low-risk pregnancy and has Depression; Generalized anxiety disorder; Supervision of other normal pregnancy, antepartum; History of gestational diabetes in prior pregnancy, currently pregnant; and Hypothyroidism affecting pregnancy, antepartum on their problem list.  ----------------------------------------------------------------------------------- Patient reports no complaints.   Contractions: Not present. Vag. Bleeding: None.  Movement: Absent. Leaking Fluid denies.  ----------------------------------------------------------------------------------- The following portions of the patient's history were reviewed and updated as appropriate: allergies, current medications, past family history, past medical history, past social history, past surgical history and problem list. Problem list updated.  Objective  Blood pressure 114/70, weight 164 lb (74.4 kg), last menstrual period 10/12/2019, unknown if currently breastfeeding. Pregravid weight 162 lb (73.5 kg) Total Weight Gain 2 lb (0.907 kg) Urinalysis: Urine Protein    Urine Glucose    Fetal Status: Fetal Heart Rate (bpm): 172   Movement: Absent     General:  Alert, oriented and cooperative. Patient is in no acute distress.  Skin: Skin is warm and dry. No rash noted.   Cardiovascular: Normal heart rate noted  Respiratory: Normal respiratory effort, no problems with respiration noted  Abdomen: Soft, gravid, appropriate for gestational age. Pain/Pressure: Absent     Pelvic:  Cervical exam deferred        Extremities: Normal range of motion.     Mental Status: Normal mood and affect. Normal behavior. Normal judgment and thought content.   Assessment   29 y.o. [id number] at 108w2d by  07/24/2020, by Ultrasound presenting for routine prenatal visit  Plan    Pregnancy#4 Problems (from 10/12/19 to present)    Problem Noted Resolved   Supervision of other normal pregnancy, antepartum 11/30/2019 by Vena Austria, MD No   Overview Addendum 12/07/2019 12:00 PM by Natale Milch, MD     Nursing Staff Provider  Office Location  [facility] Dating   7 wk [place]  Language  English Anatomy US    Flu Vaccine  07/2019 Genetic Screen  NIPS:   AFP:   First Screen:    TDaP vaccine    Hgb A1C or  GTT Early hgbA1c: normal Third trimester :   Rhogam   [ ]  28wks   LAB RESULTS   Feeding Plan  Blood Type A/Negative/-- (02/10 1601)   Contraception  Antibody Negative (02/10 1601)  Circumcision  Rubella 4.06 (02/10 1601)  Pediatrician   RPR Non Reactive (02/10 1601)   Support Person  HBsAg Negative (02/10 1601)   Prenatal Classes  HIV Non Reactive (02/10 1601)    Varicella immune  BTL Consent  GBS  (For PCN allergy, check sensitivities)        VBAC Consent  Pap  NIL 2021    Hgb Electro      CF      SMA               History of gestational diabetes in prior pregnancy, currently pregnant 11/30/2019 by 01/28/2020, MD No   Hypothyroidism affecting pregnancy, antepartum 11/30/2019 by 01/28/2020, MD No       Preterm labor symptoms and general obstetric precautions including but not limited to vaginal bleeding, contractions, leaking of fluid and fetal movement were reviewed in detail with the patient. Please refer to After Visit Summary for other counseling recommendations.   Return in about 1 week (around 12/29/2019) for Routine  Prenatal Appointment/ labs ([id number]).  Prentice Docker, MD, [facility], [facility] 12/22/2019 1:47 PM

## 2019-12-30 ENCOUNTER — Ambulatory Visit (INDEPENDENT_AMBULATORY_CARE_PROVIDER_SITE_OTHER): Payer: 59 | Admitting: Obstetrics and Gynecology

## 2019-12-30 ENCOUNTER — Other Ambulatory Visit: Payer: Self-pay

## 2019-12-30 ENCOUNTER — Encounter: Payer: Self-pay | Admitting: Obstetrics and Gynecology

## 2019-12-30 VITALS — BP 116/74 | Wt 163.0 lb

## 2019-12-30 DIAGNOSIS — Z3480 Encounter for supervision of other normal pregnancy, unspecified trimester: Secondary | ICD-10-CM

## 2019-12-30 DIAGNOSIS — O09291 Supervision of pregnancy with other poor reproductive or obstetric history, first trimester: Secondary | ICD-10-CM

## 2019-12-30 DIAGNOSIS — O99281 Endocrine, nutritional and metabolic diseases complicating pregnancy, first trimester: Secondary | ICD-10-CM

## 2019-12-30 DIAGNOSIS — Z3A10 10 weeks gestation of pregnancy: Secondary | ICD-10-CM | POA: Diagnosis not present

## 2019-12-30 DIAGNOSIS — O09299 Supervision of pregnancy with other poor reproductive or obstetric history, unspecified trimester: Secondary | ICD-10-CM

## 2019-12-30 DIAGNOSIS — Z1379 Encounter for other screening for genetic and chromosomal anomalies: Secondary | ICD-10-CM | POA: Diagnosis not present

## 2019-12-30 DIAGNOSIS — Z8632 Personal history of gestational diabetes: Secondary | ICD-10-CM

## 2019-12-30 DIAGNOSIS — O99280 Endocrine, nutritional and metabolic diseases complicating pregnancy, unspecified trimester: Secondary | ICD-10-CM

## 2019-12-30 DIAGNOSIS — E039 Hypothyroidism, unspecified: Secondary | ICD-10-CM

## 2019-12-30 NOTE — Progress Notes (Signed)
Routine Prenatal Care Visit  Subjective  Abigail Lowe is a 29 y.o. [phone] at [redacted]w[redacted]d being seen today for ongoing prenatal care.  She is currently monitored for the following issues for this high-risk pregnancy and has Depression; Generalized anxiety disorder; Supervision of other normal pregnancy, antepartum; History of gestational diabetes in prior pregnancy, currently pregnant; and Hypothyroidism affecting pregnancy, antepartum on their problem list.  ----------------------------------------------------------------------------------- Patient reports no complaints.   Contractions: Not present. Vag. Bleeding: None.  Movement: Absent. Leaking Fluid denies.  ----------------------------------------------------------------------------------- The following portions of the patient's history were reviewed and updated as appropriate: allergies, current medications, past family history, past medical history, past social history, past surgical history and problem list. Problem list updated.  Objective  Blood pressure 116/74, weight 163 lb (73.9 kg), last menstrual period 10/12/2019, unknown if currently breastfeeding. Pregravid weight 162 lb (73.5 kg) Total Weight Gain 1 lb (0.454 kg) Urinalysis: Urine Protein    Urine Glucose    Fetal Status: Fetal Heart Rate (bpm): 173   Movement: Absent     General:  Alert, oriented and cooperative. Patient is in no acute distress.  Skin: Skin is warm and dry. No rash noted.   Cardiovascular: Normal heart rate noted  Respiratory: Normal respiratory effort, no problems with respiration noted  Abdomen: Soft, gravid, appropriate for gestational age. Pain/Pressure: Absent     Pelvic:  Cervical exam deferred        Extremities: Normal range of motion.     Mental Status: Normal mood and affect. Normal behavior. Normal judgment and thought content.   Assessment   29 y.o. [id number] at [redacted]w[redacted]d by  07/24/2020, by Ultrasound presenting for routine prenatal visit  Plan    Pregnancy#4 Problems (from 10/12/19 to present)    Problem Noted Resolved   Supervision of other normal pregnancy, antepartum 11/30/2019 by Vena Austria, MD No   Overview Addendum 12/07/2019 12:00 PM by Natale Milch, MD     Nursing Staff Provider  Office Location  [facility] Dating   7 wk [place]  Language  English Anatomy US    Flu Vaccine  07/2019 Genetic Screen  NIPS:   AFP:   First Screen:    TDaP vaccine    Hgb A1C or  GTT Early hgbA1c: normal Third trimester :   Rhogam   [ ]  28wks   LAB RESULTS   Feeding Plan  Blood Type A/Negative/-- (02/10 1601)   Contraception  Antibody Negative (02/10 1601)  Circumcision  Rubella 4.06 (02/10 1601)  Pediatrician   RPR Non Reactive (02/10 1601)   Support Person  HBsAg Negative (02/10 1601)   Prenatal Classes  HIV Non Reactive (02/10 1601)    Varicella immune  BTL Consent  GBS  (For PCN allergy, check sensitivities)        VBAC Consent  Pap  NIL 2021    Hgb Electro      CF      SMA               History of gestational diabetes in prior pregnancy, currently pregnant 11/30/2019 by 01/28/2020, MD No   Hypothyroidism affecting pregnancy, antepartum 11/30/2019 by 01/28/2020, MD No       Preterm labor symptoms and general obstetric precautions including but not limited to vaginal bleeding, contractions, leaking of fluid and fetal movement were reviewed in detail with the patient. Please refer to After Visit Summary for other counseling recommendations.   - NIPT today  Return in about 1 week (  around 01/06/2020) for Routine Prenatal Appointment.  Prentice Docker, MD, [facility], [facility] 12/30/2019 3:59 PM

## 2020-01-04 ENCOUNTER — Encounter: Payer: Self-pay | Admitting: Advanced Practice Midwife

## 2020-01-04 ENCOUNTER — Other Ambulatory Visit: Payer: Self-pay

## 2020-01-04 ENCOUNTER — Ambulatory Visit (INDEPENDENT_AMBULATORY_CARE_PROVIDER_SITE_OTHER): Payer: 59 | Admitting: Advanced Practice Midwife

## 2020-01-04 VITALS — BP 118/74 | Wt 162.0 lb

## 2020-01-04 DIAGNOSIS — Z3A11 11 weeks gestation of pregnancy: Secondary | ICD-10-CM

## 2020-01-04 DIAGNOSIS — Z3481 Encounter for supervision of other normal pregnancy, first trimester: Secondary | ICD-10-CM

## 2020-01-04 NOTE — Progress Notes (Signed)
Routine Prenatal Care Visit  Subjective  Abigail Lowe is a 29 y.o. [id number] at [redacted]w[redacted]d being seen today for ongoing prenatal care.  She is currently monitored for the following issues for this low-risk pregnancy and has Depression; Generalized anxiety disorder; Supervision of other normal pregnancy, antepartum; History of gestational diabetes in prior pregnancy, currently pregnant; and Hypothyroidism affecting pregnancy, antepartum on their problem list.  ----------------------------------------------------------------------------------- Patient reports some mild pain bilateral lower abdomen. Contractions: Not present. Vag. Bleeding: None.  Movement: Absent. Leaking Fluid denies.  ----------------------------------------------------------------------------------- The following portions of the patient's history were reviewed and updated as appropriate: allergies, current medications, past family history, past medical history, past social history, past surgical history and problem list. Problem list updated.  Objective  Blood pressure 118/74, weight 162 lb (73.5 kg), last menstrual period 10/12/2019, unknown if currently breastfeeding. Pregravid weight 162 lb (73.5 kg) Total Weight Gain 0 lb (0 kg) Urinalysis: Urine Protein    Urine Glucose    Fetal Status: Fetal Heart Rate (bpm): 160   Movement: Absent     General:  Alert, oriented and cooperative. Patient is in no acute distress.  Skin: Skin is warm and dry. No rash noted.   Cardiovascular: Normal heart rate noted  Respiratory: Normal respiratory effort, no problems with respiration noted  Abdomen: Soft, gravid, appropriate for gestational age. Pain/Pressure: Absent     Pelvic:  Cervical exam deferred        Extremities: Normal range of motion.     Mental Status: Normal mood and affect. Normal behavior. Normal judgment and thought content.   Assessment   29 y.o. [id number] at [redacted]w[redacted]d by  07/24/2020, by Ultrasound presenting for routine  prenatal visit  Plan   Pregnancy#4 Problems (from 10/12/19 to present)    Problem Noted Resolved   Supervision of other normal pregnancy, antepartum 11/30/2019 by Vena Austria, MD No   Overview Addendum 12/07/2019 12:00 PM by Natale Milch, MD     Nursing Staff Provider  Office Location  [facility] Dating   7 wk [place]  Language  English Anatomy US    Flu Vaccine  07/2019 Genetic Screen  NIPS:   AFP:   First Screen:    TDaP vaccine    Hgb A1C or  GTT Early hgbA1c: normal Third trimester :   Rhogam   [ ]  28wks   LAB RESULTS   Feeding Plan  Blood Type A/Negative/-- (02/10 1601)   Contraception  Antibody Negative (02/10 1601)  Circumcision  Rubella 4.06 (02/10 1601)  Pediatrician   RPR Non Reactive (02/10 1601)   Support Person  HBsAg Negative (02/10 1601)   Prenatal Classes  HIV Non Reactive (02/10 1601)    Varicella immune  BTL Consent  GBS  (For PCN allergy, check sensitivities)        VBAC Consent  Pap  NIL 2021    Hgb Electro      CF      SMA               History of gestational diabetes in prior pregnancy, currently pregnant 11/30/2019 by 01/28/2020, MD No   Hypothyroidism affecting pregnancy, antepartum 11/30/2019 by 01/28/2020, MD No       Preterm labor symptoms and general obstetric precautions including but not limited to vaginal bleeding, contractions, leaking of fluid and fetal movement were reviewed in detail with the patient.   Return in about 1 week (around 01/11/2020) for rob.  01/13/2020, CNM 01/04/2020 4:41 PM

## 2020-01-04 NOTE — Progress Notes (Signed)
No vb. No lof.  

## 2020-01-06 LAB — MATERNIT 21 PLUS CORE, BLOOD
Fetal Fraction: 6
Result (T21): NEGATIVE
Trisomy 13 (Patau syndrome): NEGATIVE
Trisomy 18 (Edwards syndrome): NEGATIVE
Trisomy 21 (Down syndrome): NEGATIVE

## 2020-01-13 ENCOUNTER — Other Ambulatory Visit: Payer: Self-pay

## 2020-01-13 ENCOUNTER — Ambulatory Visit (INDEPENDENT_AMBULATORY_CARE_PROVIDER_SITE_OTHER): Payer: 59 | Admitting: Certified Nurse Midwife

## 2020-01-13 VITALS — BP 120/70 | Wt 162.0 lb

## 2020-01-13 DIAGNOSIS — E039 Hypothyroidism, unspecified: Secondary | ICD-10-CM

## 2020-01-13 DIAGNOSIS — Z3A12 12 weeks gestation of pregnancy: Secondary | ICD-10-CM

## 2020-01-13 DIAGNOSIS — O99281 Endocrine, nutritional and metabolic diseases complicating pregnancy, first trimester: Secondary | ICD-10-CM

## 2020-01-13 DIAGNOSIS — Z3480 Encounter for supervision of other normal pregnancy, unspecified trimester: Secondary | ICD-10-CM

## 2020-01-13 DIAGNOSIS — R110 Nausea: Secondary | ICD-10-CM

## 2020-01-13 LAB — POCT URINALYSIS DIPSTICK OB: Glucose, UA: NEGATIVE

## 2020-01-13 MED ORDER — ONDANSETRON 4 MG PO TBDP: 4.0000 mg | ORAL_TABLET | Freq: Four times a day (QID) | ORAL | 1 refills | Status: DC | PRN

## 2020-01-13 NOTE — Progress Notes (Signed)
C/o has a hard time taking pills and doesn't think liquid form will help either.rj

## 2020-01-13 NOTE — Progress Notes (Signed)
ROB at 12wk 3days: having continued problems with nausea. Can't take Synthroid due to nausea. Difficulty taking Diclegis due to difficulty taking pills.  MaterniT 21 test was negative. Gender is a secret for now FHTs WNL. FH at U-4FB. Weight is stable.  Trial of Zofran ODT 4 mgm prior to taking Synthroid ROB in 4 weeks or sooner if unable to keep down Synthroid. With the help of Zofran ODT. MSAFP next visit if patient desires.  Farrel Conners, CNM

## 2020-01-30 ENCOUNTER — Encounter: Payer: 59 | Admitting: Obstetrics & Gynecology

## 2020-02-10 ENCOUNTER — Ambulatory Visit (INDEPENDENT_AMBULATORY_CARE_PROVIDER_SITE_OTHER): Payer: 59 | Admitting: Certified Nurse Midwife

## 2020-02-10 ENCOUNTER — Other Ambulatory Visit: Payer: Self-pay

## 2020-02-10 VITALS — BP 122/76 | Wt 163.0 lb

## 2020-02-10 DIAGNOSIS — Z3482 Encounter for supervision of other normal pregnancy, second trimester: Secondary | ICD-10-CM

## 2020-02-10 DIAGNOSIS — Z1379 Encounter for other screening for genetic and chromosomal anomalies: Secondary | ICD-10-CM | POA: Diagnosis not present

## 2020-02-10 DIAGNOSIS — Z3A16 16 weeks gestation of pregnancy: Secondary | ICD-10-CM | POA: Diagnosis not present

## 2020-02-10 DIAGNOSIS — Z363 Encounter for antenatal screening for malformations: Secondary | ICD-10-CM

## 2020-02-10 LAB — POCT URINALYSIS DIPSTICK OB
Glucose, UA: NEGATIVE
POC,PROTEIN,UA: NEGATIVE

## 2020-02-10 NOTE — Progress Notes (Signed)
No vb. No lof.  

## 2020-02-11 NOTE — Progress Notes (Signed)
ROB at 16wk3d: Is feeling some fetal movement. Complains of left temporal headaches for the past 4-6 weeks. Does not have headaches outside of pregnancy.No visual changes or paresthesias with headaches. Some photophobia. Usually resolves with rest.  MaterniT 21 results were normal diploid XY. Desires MSAFP  FH U-2FB/ FHT 150s  A: IUP at 16wk3d Headaches in pregnancy  P: Reviewed food and fluid intake-encouraged increased fluids Trial of Excedrin Tension Headache medicine OTC MSAFP today Anatomy scan in 3-4 weeks  Farrel Conners, CNM

## 2020-02-12 LAB — AFP, SERUM, OPEN SPINA BIFIDA
AFP MoM: 0.87
AFP Value: 29.8 ng/mL
Gest. Age on Collection Date: 16.4 weeks
Maternal Age At EDD: 28.6 yr
OSBR Risk 1 IN: 10000
Test Results:: NEGATIVE
Weight: 163 [lb_av]

## 2020-02-15 ENCOUNTER — Encounter: Payer: Self-pay | Admitting: Certified Nurse Midwife

## 2020-03-09 ENCOUNTER — Other Ambulatory Visit: Payer: Self-pay

## 2020-03-09 ENCOUNTER — Ambulatory Visit (INDEPENDENT_AMBULATORY_CARE_PROVIDER_SITE_OTHER): Payer: 59 | Admitting: Obstetrics and Gynecology

## 2020-03-09 ENCOUNTER — Ambulatory Visit (INDEPENDENT_AMBULATORY_CARE_PROVIDER_SITE_OTHER): Payer: 59

## 2020-03-09 ENCOUNTER — Encounter: Payer: Self-pay | Admitting: Obstetrics and Gynecology

## 2020-03-09 VITALS — BP 124/70 | Wt 164.0 lb

## 2020-03-09 DIAGNOSIS — Z3480 Encounter for supervision of other normal pregnancy, unspecified trimester: Secondary | ICD-10-CM

## 2020-03-09 DIAGNOSIS — Z363 Encounter for antenatal screening for malformations: Secondary | ICD-10-CM

## 2020-03-09 DIAGNOSIS — Z8632 Personal history of gestational diabetes: Secondary | ICD-10-CM

## 2020-03-09 DIAGNOSIS — O09299 Supervision of pregnancy with other poor reproductive or obstetric history, unspecified trimester: Secondary | ICD-10-CM

## 2020-03-09 DIAGNOSIS — O99280 Endocrine, nutritional and metabolic diseases complicating pregnancy, unspecified trimester: Secondary | ICD-10-CM

## 2020-03-09 DIAGNOSIS — E039 Hypothyroidism, unspecified: Secondary | ICD-10-CM

## 2020-03-09 DIAGNOSIS — Z3A20 20 weeks gestation of pregnancy: Secondary | ICD-10-CM

## 2020-03-09 NOTE — Progress Notes (Signed)
Routine Prenatal Care Visit  Subjective  Abigail Lowe is a 29 y.o. [phone] at [redacted]w[redacted]d being seen today for ongoing prenatal care.  She is currently monitored for the following issues for this high-risk pregnancy and has Depression; Generalized anxiety disorder; Supervision of other normal pregnancy, antepartum; History of gestational diabetes in prior pregnancy, currently pregnant; and Hypothyroidism affecting pregnancy, antepartum on their problem list.  ----------------------------------------------------------------------------------- Patient reports no complaints.   Contractions: Not present. Vag. Bleeding: None.  Movement: Present. Leaking Fluid denies.  Anatomy u/s complete today. ----------------------------------------------------------------------------------- The following portions of the patient's history were reviewed and updated as appropriate: allergies, current medications, past family history, past medical history, past social history, past surgical history and problem list. Problem list updated.  Objective  Blood pressure 124/70, weight 164 lb (74.4 kg), last menstrual period 10/12/2019, unknown if currently breastfeeding. Pregravid weight 162 lb (73.5 kg) Total Weight Gain 2 lb (0.907 kg) Urinalysis: Urine Protein    Urine Glucose    Fetal Status: Fetal Heart Rate (bpm): 150   Movement: Present     General:  Alert, oriented and cooperative. Patient is in no acute distress.  Skin: Skin is warm and dry. No rash noted.   Cardiovascular: Normal heart rate noted  Respiratory: Normal respiratory effort, no problems with respiration noted  Abdomen: Soft, gravid, appropriate for gestational age. Pain/Pressure: Absent     Pelvic:  Cervical exam deferred        Extremities: Normal range of motion.     Mental Status: Normal mood and affect. Normal behavior. Normal judgment and thought content.   Imaging Results US OB Comp + 14 Wk  Result Date: 03/09/2020 Patient Name: FREEDA SPIVEY DOB: 01-13-1991 MRN: [id number] ULTRASOUND REPORT Location: [facility] Date of Service: 03/09/2020 Indications:Anatomy Ultrasound Findings: Mason Jim intrauterine pregnancy is visualized with FHR at 150 BPM. Biometrics give an (U/S) Gestational age of [redacted]w[redacted]d and an (U/S) EDD of 07/22/2020; this correlates with the clinically established Estimated Date of Delivery: 07/24/2020. Fetal presentation is Variable. EFW: 394 g ( 14 oz ). Placenta: anterior. Grade: 0 AFI: subjectively normal. Anatomic survey is complete and normal; Gender - female.  Impression: 1. [redacted]w[redacted]d Viable Singleton Intrauterine pregnancy by U/S. 2. (U/S) EDD is consistent with Clinically established Estimated Date of Delivery: 07/24/20 . 3. Normal Anatomy Scan Deanna Artis, RT There is a singleton gestation with subjectively normal amniotic fluid volume. The fetal biometry correlates with established dating. Detailed evaluation of the fetal anatomy was performed.The fetal anatomical survey appears within normal limits within the resolution of ultrasound as described above.  It must be noted that a normal ultrasound is unable to rule out fetal aneuploidy nor is it able to detect all possible malformations.   The ultrasound images and findings were reviewed by me and I agree with the above report. Thomasene Mohair, MD, [facility], [facility] 03/09/2020 4:50 PM       Assessment   29 y.o. [id number] at [redacted]w[redacted]d by  07/24/2020, by Ultrasound presenting for routine prenatal visit  Plan   Pregnancy#4 Problems (from 10/12/19 to present)    Problem Noted Resolved   Supervision of other normal pregnancy, antepartum 11/30/2019 by Vena Austria, MD No   Overview Addendum 02/15/2020  6:31 PM by Farrel Conners, CNM     Nursing Staff Provider  Office Location  [facility] Dating   7 wk [place]  Language  English Anatomy US    Flu Vaccine  07/2019 Genetic Screen  NIPS:diploid  XY   AFP: neg    TDaP vaccine    Hgb A1C or  GTT  Early hgbA1c: normal Third trimester :   Rhogam   [ ]  28wks   LAB RESULTS   Feeding Plan  Blood Type A/Negative/-- (02/10 1601)   Contraception  Antibody Negative (02/10 1601)  Circumcision  Rubella 4.06 (02/10 1601)  Pediatrician   RPR Non Reactive (02/10 1601)   Support Person  HBsAg Negative (02/10 1601)   Prenatal Classes  HIV Non Reactive (02/10 1601)    Varicella immune  BTL Consent  GBS  (For PCN allergy, check sensitivities)        VBAC Consent  Pap  NIL 2021    Hgb Electro      CF      SMA               History of gestational diabetes in prior pregnancy, currently pregnant 11/30/2019 by Malachy Mood, MD No   Hypothyroidism affecting pregnancy, antepartum 11/30/2019 by Malachy Mood, MD No       Preterm labor symptoms and general obstetric precautions including but not limited to vaginal bleeding, contractions, leaking of fluid and fetal movement were reviewed in detail with the patient. Please refer to After Visit Summary for other counseling recommendations.   Return in about 4 weeks (around 04/06/2020) for Routine Prenatal Appointment.  Prentice Docker, MD, [facility], [facility] 03/09/2020 5:01 PM

## 2020-03-23 ENCOUNTER — Emergency Department
Admission: EM | Admit: 2020-03-23 | Discharge: 2020-03-23 | Disposition: A | Discharge status: Home / Self Care | Payer: 59 | Attending: Emergency Medicine | Admitting: Emergency Medicine

## 2020-03-23 ENCOUNTER — Emergency Department: Payer: 59

## 2020-03-23 ENCOUNTER — Other Ambulatory Visit: Payer: Self-pay

## 2020-03-23 ENCOUNTER — Encounter: Payer: Self-pay | Admitting: Radiology

## 2020-03-23 DIAGNOSIS — R0789 Other chest pain: Secondary | ICD-10-CM | POA: Insufficient documentation

## 2020-03-23 DIAGNOSIS — Z3A22 22 weeks gestation of pregnancy: Secondary | ICD-10-CM | POA: Diagnosis not present

## 2020-03-23 DIAGNOSIS — R079 Chest pain, unspecified: Secondary | ICD-10-CM | POA: Diagnosis not present

## 2020-03-23 DIAGNOSIS — R0602 Shortness of breath: Secondary | ICD-10-CM | POA: Insufficient documentation

## 2020-03-23 DIAGNOSIS — R000 Tachycardia, unspecified: Secondary | ICD-10-CM | POA: Diagnosis not present

## 2020-03-23 DIAGNOSIS — O26892 Other specified pregnancy related conditions, second trimester: Secondary | ICD-10-CM | POA: Insufficient documentation

## 2020-03-23 LAB — BRAIN NATRIURETIC PEPTIDE: B Natriuretic Peptide: 45.8 pg/mL (ref 0.0–100.0)

## 2020-03-23 LAB — CBC
HCT: 33.7 % — ABNORMAL LOW (ref 36.0–46.0)
Hemoglobin: 11.4 g/dL — ABNORMAL LOW (ref 12.0–15.0)
MCH: 28.9 pg (ref 26.0–34.0)
MCHC: 33.8 g/dL (ref 30.0–36.0)
MCV: 85.5 fL (ref 80.0–100.0)
Platelets: 259 10*3/uL (ref 150–400)
RBC: 3.94 MIL/uL (ref 3.87–5.11)
RDW: 13.1 % (ref 11.5–15.5)
WBC: 8.8 10*3/uL (ref 4.0–10.5)
nRBC: 0 % (ref 0.0–0.2)

## 2020-03-23 LAB — COMPREHENSIVE METABOLIC PANEL
ALT: 34 U/L (ref 0–44)
AST: 29 U/L (ref 15–41)
Albumin: 3.2 g/dL — ABNORMAL LOW (ref 3.5–5.0)
Alkaline Phosphatase: 88 U/L (ref 38–126)
Anion gap: 11 (ref 5–15)
BUN: 5 mg/dL — ABNORMAL LOW (ref 6–20)
CO2: 22 mmol/L (ref 22–32)
Calcium: 8.7 mg/dL — ABNORMAL LOW (ref 8.9–10.3)
Chloride: 101 mmol/L (ref 98–111)
Creatinine, Ser: 0.65 mg/dL (ref 0.44–1.00)
GFR calc Af Amer: >60 mL/min (ref >60)
GFR calc non Af Amer: >60 mL/min (ref >60)
Glucose, Bld: 137 mg/dL — ABNORMAL HIGH (ref 70–99)
Potassium: 3.5 mmol/L (ref 3.5–5.1)
Sodium: 134 mmol/L — ABNORMAL LOW (ref 135–145)
Total Bilirubin: 0.5 mg/dL (ref 0.3–1.2)
Total Protein: 7.4 g/dL (ref 6.5–8.1)

## 2020-03-23 LAB — TROPONIN I (HIGH SENSITIVITY): Troponin I (High Sensitivity): <2 ng/L

## 2020-03-23 LAB — FIBRIN DERIVATIVES D-DIMER (ARMC ONLY): Fibrin derivatives D-dimer (ARMC): 1547.64 ng/mL (FEU) — ABNORMAL HIGH (ref 0.00–499.00)

## 2020-03-23 MED ORDER — PANTOPRAZOLE SODIUM 20 MG PO TBEC
20.0000 mg | DELAYED_RELEASE_TABLET | Freq: Every day | ORAL | 1 refills | Status: DC
Start: 2020-03-23 — End: 2020-04-24

## 2020-03-23 MED ORDER — IOHEXOL 350 MG/ML SOLN
75.0000 mL | Freq: Once | INTRAVENOUS | Status: AC | PRN
  Administered 2020-03-23: 75 mL via INTRAVENOUS

## 2020-03-23 MED ORDER — SODIUM CHLORIDE 0.9 % IV BOLUS
1000.0000 mL | Freq: Once | INTRAVENOUS | Status: AC
  Administered 2020-03-23: 1000 mL via INTRAVENOUS

## 2020-03-23 MED ORDER — LIDOCAINE VISCOUS HCL 2 % MT SOLN
15.0000 mL | Freq: Once | OROMUCOSAL | Status: AC
  Administered 2020-03-23: 15 mL via OROMUCOSAL
  Filled 2020-03-23: qty 15

## 2020-03-23 NOTE — ED Provider Notes (Signed)
[facility] Emergency Department Provider Note   ____________________________________________   I have reviewed the triage vital signs and the nursing notes.   HISTORY  Chief Complaint Chest Pain and Tachycardia   History limited by: Not Limited   HPI Abigail Lowe is a 29 y.o. female who presents to the emergency department today because of concern for chest pain, shortness of breath and tachycardia.  The patient is currently [redacted] weeks pregnant.  She states she started having the chest pain that last night.  She describes it as stabbing.  Located in the left chest does radiate around to her left upper back.  She did not notice any positioning that would make the pain better or worse.  She has had some accompanying shortness of breath.  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  The doctor at her facility checked her pulse and found it to be significantly elevated.  Patient has not appreciated that her heart has been racing.  She denies any history of significant tachycardia.  She states that her only chronic problem is hypothyroidism and that she has not had any recent changes to her medications.   Records reviewed. Per medical record review patient has a history of anxiety, depression.  Past Medical History:  Diagnosis Date  . Anxiety   . Depression   . Gestational diabetes   . Strep pharyngitis 12/15/2017    Patient Active Problem List   Diagnosis Date Noted  . Supervision of other normal pregnancy, antepartum 11/30/2019  . History of gestational diabetes in prior pregnancy, currently pregnant 11/30/2019  . Hypothyroidism affecting pregnancy, antepartum 11/30/2019  . Depression 12/18/2017  . Generalized anxiety disorder 12/18/2017    Past Surgical History:  Procedure Laterality Date  . CHOLECYSTECTOMY N/A 11/25/2016   Procedure: LAPAROSCOPIC CHOLECYSTECTOMY WITH INTRAOPERATIVE CHOLANGIOGRAM;  Surgeon: Donnie Mesa, MD;  Location: [place];   Service: General;  Laterality: N/A;  . DILATION AND CURETTAGE OF UTERUS  11/2018   SAB  . INTRAUTERINE DEVICE (IUD) INSERTION      Prior to Admission medications   Medication Sig Start Date End Date Taking? Authorizing Provider  levothyroxine (SYNTHROID) 25 MCG tablet Take 25 mcg by mouth daily. 10/18/19   [provider]  Prenatal MV & Min w/FA-DHA (PRENATAL ADULT GUMMY/DHA/FA PO) Take by mouth.    [provider]    Allergies Patient has no known allergies.  Family History  Problem Relation Age of Onset  . Hypertension Father   . Hyperlipidemia Father   . Heart disease Maternal Grandfather   . Cancer Maternal Grandfather   . Cervical cancer Mother   . ADD / ADHD Brother   . Hypertension Maternal Grandmother   . Cervical cancer Maternal Grandmother   . Cancer Paternal Grandfather   . Diabetes Paternal Grandfather   . Hyperlipidemia Paternal Grandfather     Social History Social History   Tobacco Use  . Smoking status: Never Smoker  . Smokeless tobacco: Never Used  Substance Use Topics  . Alcohol use: No  . Drug use: No    Review of Systems Constitutional: No fever/chills Eyes: No visual changes. ENT: No sore throat. Cardiovascular: Positive for chest pain. Respiratory: Positive for shortness of breath. Gastrointestinal: No abdominal pain.  No nausea, no vomiting.  No diarrhea.   Genitourinary: Negative for dysuria. Musculoskeletal: Negative for back pain. Skin: Negative for rash. Neurological: Negative for headaches, focal weakness or numbness.  ____________________________________________   PHYSICAL EXAM:  VITAL SIGNS: ED  Triage Vitals  Enc Vitals Group     BP 03/23/20 1356 124/80     Pulse Rate 03/23/20 1356 (!) 128     Resp 03/23/20 1356 18     Temp 03/23/20 1356 98.8 F (37.1 C)     Temp Source 03/23/20 1356 Oral     SpO2 03/23/20 1356 97 %     Weight 03/23/20 1356 164 lb (74.4 kg)     Height 03/23/20 1356 5\' 2"  (1.575 m)      Head Circumference --      Peak Flow --      Pain Score 03/23/20 1406 6    Constitutional: Alert and oriented.  Eyes: Conjunctivae are normal.  ENT      Head: Normocephalic and atraumatic.      Nose: No congestion/rhinnorhea.      Mouth/Throat: Mucous membranes are moist.      Neck: No stridor. Hematological/Lymphatic/Immunilogical: No cervical lymphadenopathy. Cardiovascular: Tachycardia, regular rhythm.  No murmurs, rubs, or gallops. Respiratory: Normal respiratory effort without tachypnea nor retractions. Breath sounds are clear and equal bilaterally. No wheezes/rales/rhonchi. Gastrointestinal: Soft and non tender. No rebound. No guarding.  Genitourinary: Deferred Musculoskeletal: Normal range of motion in all extremities. No lower extremity edema. Neurologic:  Normal speech and language. No gross focal neurologic deficits are appreciated.  Skin:  Skin is warm, dry and intact. No rash noted. Psychiatric: Mood and affect are normal. Speech and behavior are normal. Patient exhibits appropriate insight and judgment.  ____________________________________________    LABS (pertinent positives/negatives)  CBC wbc 8.8, hgb 11.4, plt 259 CMP na 134, k 3.5, glu 137, cr 0.65 D-dimer 1547.64 Trop hs <2 BNP 45.8 ____________________________________________   EKG  I, 05/23/20, attending physician, personally viewed and interpreted this EKG  EKG Time: 1401 Rate: 136 Rhythm: sinus tachycardia Axis: rightward axis Intervals: qtc 445 QRS: narrow ST changes: no st elevation Impression: abnormal ekg  ____________________________________________    RADIOLOGY  CTAPE No pulmonary embolism. No pneumonia  ____________________________________________   PROCEDURES  Procedures  ____________________________________________   INITIAL IMPRESSION / ASSESSMENT AND PLAN / ED COURSE  Pertinent labs & imaging results that were available during my care of the patient were  reviewed by me and considered in my medical decision making (see chart for details).   Patient presented to the emergency department today because of concerns for chest pain as well as tachycardia.  Patient is roughly [redacted] weeks pregnant.  Given the nature of the chest pain and shortness of breath I did discuss concern for possible pulmonary embolism.  We did discuss risk of CT scan.  CT angio was obtained which did not show pulmonary embolism or pneumonia.  Patient's tachycardia did significantly improve with IV fluids.  Do wonder if the pain could be related to acid reflux.  At this point given negative CT finding and negative blood work finding I think is reasonable for patient be discharged home. Discussed findings and plan with patient.  ____________________________________________   FINAL CLINICAL IMPRESSION(S) / ED DIAGNOSES  Final diagnoses:  Sinus tachycardia  Nonspecific chest pain     Note: This dictation was prepared with Dragon dictation. Any transcriptional errors that result from this process are unintentional     Phineas Semen, MD 03/24/20 1525

## 2020-03-23 NOTE — Discharge Instructions (Addendum)
Please seek medical attention for any high fevers, chest pain, shortness of breath, change in behavior, persistent vomiting, bloody stool or any other new or concerning symptoms.  

## 2020-03-23 NOTE — ED Triage Notes (Signed)
Pt states last night her chest started hurting on the left side, pain was stabbing and radiating to the left side of her back, pt c/o left sided back pain, pt states that she just hasn't felt well today and is hurting under her left breast

## 2020-03-23 NOTE — ED Notes (Signed)
MD at bedside. 

## 2020-03-29 DIAGNOSIS — L709 Acne, unspecified: Secondary | ICD-10-CM | POA: Diagnosis not present

## 2020-03-29 DIAGNOSIS — E039 Hypothyroidism, unspecified: Secondary | ICD-10-CM | POA: Diagnosis not present

## 2020-03-29 DIAGNOSIS — Z3A23 23 weeks gestation of pregnancy: Secondary | ICD-10-CM | POA: Diagnosis not present

## 2020-03-29 DIAGNOSIS — E871 Hypo-osmolality and hyponatremia: Secondary | ICD-10-CM | POA: Diagnosis not present

## 2020-03-29 DIAGNOSIS — Z0000 Encounter for general adult medical examination without abnormal findings: Secondary | ICD-10-CM | POA: Diagnosis not present

## 2020-03-29 DIAGNOSIS — Z13220 Encounter for screening for lipoid disorders: Secondary | ICD-10-CM | POA: Diagnosis not present

## 2020-04-06 ENCOUNTER — Ambulatory Visit (INDEPENDENT_AMBULATORY_CARE_PROVIDER_SITE_OTHER): Payer: 59 | Admitting: Obstetrics and Gynecology

## 2020-04-06 ENCOUNTER — Encounter: Payer: Self-pay | Admitting: Obstetrics and Gynecology

## 2020-04-06 ENCOUNTER — Other Ambulatory Visit: Payer: Self-pay

## 2020-04-06 VITALS — BP 122/70 | Wt 171.0 lb

## 2020-04-06 DIAGNOSIS — O99280 Endocrine, nutritional and metabolic diseases complicating pregnancy, unspecified trimester: Secondary | ICD-10-CM

## 2020-04-06 DIAGNOSIS — Z131 Encounter for screening for diabetes mellitus: Secondary | ICD-10-CM

## 2020-04-06 DIAGNOSIS — E039 Hypothyroidism, unspecified: Secondary | ICD-10-CM

## 2020-04-06 DIAGNOSIS — O09299 Supervision of pregnancy with other poor reproductive or obstetric history, unspecified trimester: Secondary | ICD-10-CM

## 2020-04-06 DIAGNOSIS — Z113 Encounter for screening for infections with a predominantly sexual mode of transmission: Secondary | ICD-10-CM

## 2020-04-06 DIAGNOSIS — Z3A24 24 weeks gestation of pregnancy: Secondary | ICD-10-CM

## 2020-04-06 DIAGNOSIS — Z8632 Personal history of gestational diabetes: Secondary | ICD-10-CM

## 2020-04-06 DIAGNOSIS — Z3480 Encounter for supervision of other normal pregnancy, unspecified trimester: Secondary | ICD-10-CM

## 2020-04-06 NOTE — Progress Notes (Signed)
Routine Prenatal Care Visit  Subjective  Abigail Lowe is a 29 y.o. [phone] at [redacted]w[redacted]d being seen today for ongoing prenatal care.  She is currently monitored for the following issues for this high-risk pregnancy and has Depression; Generalized anxiety disorder; Supervision of other normal pregnancy, antepartum; History of gestational diabetes in prior pregnancy, currently pregnant; and Hypothyroidism affecting pregnancy, antepartum on their problem list.  ----------------------------------------------------------------------------------- Patient reports no complaints.   Contractions: Not present. Vag. Bleeding: None.  Movement: Present. Leaking Fluid denies.  ----------------------------------------------------------------------------------- The following portions of the patient's history were reviewed and updated as appropriate: allergies, current medications, past family history, past medical history, past social history, past surgical history and problem list. Problem list updated.  Objective  Blood pressure 122/70, weight 171 lb (77.6 kg), last menstrual period 10/12/2019, unknown if currently breastfeeding. Pregravid weight 162 lb (73.5 kg) Total Weight Gain 9 lb (4.082 kg) Urinalysis: Urine Protein    Urine Glucose    Fetal Status: Fetal Heart Rate (bpm): 145 Fundal Height: 25 cm Movement: Present     General:  Alert, oriented and cooperative. Patient is in no acute distress.  Skin: Skin is warm and dry. No rash noted.   Cardiovascular: Normal heart rate noted  Respiratory: Normal respiratory effort, no problems with respiration noted  Abdomen: Soft, gravid, appropriate for gestational age. Pain/Pressure: Absent     Pelvic:  Cervical exam deferred        Extremities: Normal range of motion.     Mental Status: Normal mood and affect. Normal behavior. Normal judgment and thought content.   Assessment   29 y.o. [id number] at [redacted]w[redacted]d by  07/24/2020, by Ultrasound presenting for routine  prenatal visit  Plan   Pregnancy#4 Problems (from 10/12/19 to present)    Problem Noted Resolved   Supervision of other normal pregnancy, antepartum 11/30/2019 by Malachy Mood, MD No   Overview Addendum 02/15/2020  6:31 PM by Dalia Heading, CNM     Nursing Staff Provider  Office Location  [facility] Dating   7 wk [place]  Language  English Anatomy US    Flu Vaccine  07/2019 Genetic Screen  NIPS:diploid XY   AFP: neg    TDaP vaccine    Hgb A1C or  GTT Early hgbA1c: normal Third trimester :   Rhogam   [ ]  28wks   LAB RESULTS   Feeding Plan  Blood Type A/Negative/-- (02/10 1601)   Contraception  Antibody Negative (02/10 1601)  Circumcision  Rubella 4.06 (02/10 1601)  Pediatrician   RPR Non Reactive (02/10 1601)   Support Person  HBsAg Negative (02/10 1601)   Prenatal Classes  HIV Non Reactive (02/10 1601)    Varicella immune  BTL Consent  GBS  (For PCN allergy, check sensitivities)        VBAC Consent  Pap  NIL 2021    Hgb Electro      CF      SMA               Previous Version   History of gestational diabetes in prior pregnancy, currently pregnant 11/30/2019 by Malachy Mood, MD No   Hypothyroidism affecting pregnancy, antepartum 11/30/2019 by Malachy Mood, MD No       Preterm labor symptoms and general obstetric precautions including but not limited to vaginal bleeding, contractions, leaking of fluid and fetal movement were reviewed in detail with the patient. Please refer to After Visit Summary for other counseling recommendations.   - rhogam next visit -  check TSH/FT4 mid 3rd trimester  Return in about 4 weeks (around 05/04/2020) for 28 week labs and routine prenatal.  Thomasene Mohair, MD, [facility], [facility] 04/06/2020 4:38 PM

## 2020-04-24 ENCOUNTER — Other Ambulatory Visit: Payer: Self-pay

## 2020-04-24 ENCOUNTER — Telehealth: Payer: Self-pay

## 2020-04-24 ENCOUNTER — Observation Stay
Admission: EM | Admit: 2020-04-24 | Discharge: 2020-04-24 | Disposition: A | Discharge status: Home / Self Care | Payer: 59 | Attending: Obstetrics and Gynecology | Admitting: Obstetrics and Gynecology

## 2020-04-24 ENCOUNTER — Encounter: Payer: Self-pay | Admitting: Advanced Practice Midwife

## 2020-04-24 DIAGNOSIS — Z3480 Encounter for supervision of other normal pregnancy, unspecified trimester: Secondary | ICD-10-CM | POA: Diagnosis not present

## 2020-04-24 DIAGNOSIS — M549 Dorsalgia, unspecified: Secondary | ICD-10-CM

## 2020-04-24 DIAGNOSIS — E039 Hypothyroidism, unspecified: Secondary | ICD-10-CM

## 2020-04-24 DIAGNOSIS — Z3A27 27 weeks gestation of pregnancy: Secondary | ICD-10-CM | POA: Diagnosis not present

## 2020-04-24 DIAGNOSIS — M545 Low back pain: Secondary | ICD-10-CM | POA: Diagnosis not present

## 2020-04-24 DIAGNOSIS — Z79899 Other long term (current) drug therapy: Secondary | ICD-10-CM | POA: Diagnosis not present

## 2020-04-24 DIAGNOSIS — Z8632 Personal history of gestational diabetes: Secondary | ICD-10-CM

## 2020-04-24 DIAGNOSIS — O99891 Other specified diseases and conditions complicating pregnancy: Secondary | ICD-10-CM | POA: Diagnosis not present

## 2020-04-24 DIAGNOSIS — O99280 Endocrine, nutritional and metabolic diseases complicating pregnancy, unspecified trimester: Secondary | ICD-10-CM

## 2020-04-24 DIAGNOSIS — O09299 Supervision of pregnancy with other poor reproductive or obstetric history, unspecified trimester: Secondary | ICD-10-CM

## 2020-04-24 LAB — CHLAMYDIA/NGC RT PCR (ARMC ONLY)
Chlamydia Tr: NOT DETECTED
N gonorrhoeae: NOT DETECTED

## 2020-04-24 LAB — WET PREP, GENITAL
Clue Cells Wet Prep HPF POC: NONE SEEN
Sperm: NONE SEEN
Trich, Wet Prep: NONE SEEN
Yeast Wet Prep HPF POC: NONE SEEN

## 2020-04-24 LAB — URINALYSIS, COMPLETE (UACMP) WITH MICROSCOPIC
Bilirubin Urine: NEGATIVE
Glucose, UA: NEGATIVE mg/dL
Hgb urine dipstick: NEGATIVE
Ketones, ur: NEGATIVE mg/dL
Leukocytes,Ua: NEGATIVE
Nitrite: NEGATIVE
Protein, ur: NEGATIVE mg/dL
Specific Gravity, Urine: 1 — ABNORMAL LOW (ref 1.005–1.030)
Squamous Epithelial / HPF: NONE SEEN (ref 0–5)
WBC, UA: NONE SEEN WBC/hpf (ref 0–5)
pH: 7 (ref 5.0–8.0)

## 2020-04-24 MED ORDER — LACTATED RINGERS IV SOLN: INTRAVENOUS | Status: DC

## 2020-04-24 MED ORDER — CYCLOBENZAPRINE HCL 10 MG PO TABS
10.0000 mg | ORAL_TABLET | Freq: Two times a day (BID) | ORAL | 2 refills | Status: DC | PRN
Start: 2020-04-24 — End: 2020-06-29

## 2020-04-24 NOTE — Telephone Encounter (Signed)
Pt calling; has severe back pain; right side only; ? Kidney stone.  [phone] pt states it's the lower part of her right side of back; radiates a little to the front off and on; no ctxs; has tried Deep Blue cream, low heat, tylenol, ice, rotating heat and ice; nothing helps; no hx of kidney stones.  Adv will send msg to doctor and call her back.

## 2020-04-24 NOTE — Discharge Summary (Signed)
Physician Final Progress Note  Patient ID: Abigail Lowe MRN: [id number] DOB/AGE: [birth year] 29 y.o.  Admit date: 04/24/2020 Admitting provider: Tresea Mall, CNM Discharge date: 04/24/2020   Admission Diagnoses: low back pain during pregnancy  Discharge Diagnoses:  Active Problems:   Labor and delivery, indication for care   Back pain affecting pregnancy in second trimester   [redacted] weeks gestation of pregnancy Reactive NST  History of Present Illness: The patient is a 29 y.o. female [phone] at [redacted]w[redacted]d who presents for sharp constant low right sided back pain that started last night. She denies any recent trauma or lifting injury. She has tried heat, ice, tylenol and position changes with minimal relief. She denies previous history of back pain during or outside of pregnancy. She reports good fetal movement. She denies contractions, leaking fluid or vaginal bleeding. She admits good hydration.  She was admitted for observation, placed on monitors, labs collected. Labs are reassuring- will send urine for culture. She is discharged to home with precautions and instructions.   Past Medical History:  Diagnosis Date  . Anxiety   . Depression   . Gestational diabetes   . Strep pharyngitis 12/15/2017    Past Surgical History:  Procedure Laterality Date  . CHOLECYSTECTOMY N/A 11/25/2016   Procedure: LAPAROSCOPIC CHOLECYSTECTOMY WITH INTRAOPERATIVE CHOLANGIOGRAM;  Surgeon: Manus Rudd, MD;  Location: [facility] OR;  Service: General;  Laterality: N/A;  . DILATION AND CURETTAGE OF UTERUS  11/2018   SAB  . INTRAUTERINE DEVICE (IUD) INSERTION      No current facility-administered medications on file prior to encounter.   Current Outpatient Medications on File Prior to Encounter  Medication Sig Dispense Refill  . levothyroxine (SYNTHROID) 25 MCG tablet Take 25 mcg by mouth daily.    . Prenatal MV & Min w/FA-DHA (PRENATAL ADULT GUMMY/DHA/FA PO) Take by mouth.      No Known Allergies  Social  History   Socioeconomic History  . Marital status: Married    Spouse name: [name]  . Number of children: 2  . Years of education: Not on file  . Highest education level: Not on file  Occupational History  . Not on file  Tobacco Use  . Smoking status: Never Smoker  . Smokeless tobacco: Never Used  Vaping Use  . Vaping Use: Never used  Substance and Sexual Activity  . Alcohol use: No  . Drug use: No  . Sexual activity: Yes    Birth control/protection: None  Other Topics Concern  . Not on file  Social History Narrative   Live with husband [name] and 2 children in [place].    Social Determinants of Health   Financial Resource Strain:   . Difficulty of Paying Living Expenses:   Food Insecurity:   . Worried About Programme researcher, broadcasting/film/video in the Last Year:   . Barista in the Last Year:   Transportation Needs:   . Freight forwarder (Medical):   Marland Kitchen Lack of Transportation (Non-Medical):   Physical Activity:   . Days of Exercise per Week:   . Minutes of Exercise per Session:   Stress:   . Feeling of Stress :   Social Connections:   . Frequency of Communication with Friends and Family:   . Frequency of Social Gatherings with Friends and Family:   . Attends Religious Services:   . Active Member of Clubs or Organizations:   . Attends Banker Meetings:   Marland Kitchen Marital Status:   Intimate Partner Violence:   .  Fear of Current or Ex-Partner:   . Emotionally Abused:   Marland Kitchen Physically Abused:   . Sexually Abused:     Family History  Problem Relation Age of Onset  . Hypertension Father   . Hyperlipidemia Father   . Heart disease Maternal Grandfather   . Cancer Maternal Grandfather   . Cervical cancer Mother   . ADD / ADHD Brother   . Hypertension Maternal Grandmother   . Cervical cancer Maternal Grandmother   . Cancer Paternal Grandfather   . Diabetes Paternal Grandfather   . Hyperlipidemia Paternal Grandfather      Review of Systems  Constitutional:  Negative for chills and fever.  HENT: Negative for congestion, ear discharge, ear pain, hearing loss, sinus pain and sore throat.   Eyes: Negative for blurred vision and double vision.  Respiratory: Negative for cough, shortness of breath and wheezing.   Cardiovascular: Negative for chest pain, palpitations and leg swelling.  Gastrointestinal: Negative for abdominal pain, blood in stool, constipation, diarrhea, heartburn, melena, nausea and vomiting.  Genitourinary: Negative for dysuria, flank pain, frequency, hematuria and urgency.  Musculoskeletal: Positive for back pain. Negative for joint pain and myalgias.  Skin: Negative for itching and rash.  Neurological: Negative for dizziness, tingling, tremors, sensory change, speech change, focal weakness, seizures, loss of consciousness, weakness and headaches.  Endo/Heme/Allergies: Negative for environmental allergies. Does not bruise/bleed easily.  Psychiatric/Behavioral: Negative for depression, hallucinations, memory loss, substance abuse and suicidal ideas. The patient is not nervous/anxious and does not have insomnia.      Physical Exam: BP 128/78 (BP Location: Left Arm)   Pulse 95   Temp 97.7 F (36.5 C) (Oral)   Resp 18   Ht 5\' 2"  (1.575 m)   Wt 77.6 kg   LMP 10/12/2019 (Exact Date)   SpO2 99%   BMI 31.28 kg/m   Constitutional: Well nourished, well developed female, mildly uncomfortable.  HEENT: normal Skin: Warm and dry.  Cardiovascular: Regular rate and rhythm.   Extremity: no edema  Respiratory: Clear to auscultation bilateral. Normal respiratory effort Abdomen: FHT present Back: no CVAT Neuro: DTRs 2+, Cranial nerves grossly intact Psych: Alert and Oriented x3. No memory deficits. Normal mood and affect.  MS: normal gait, normal bilateral lower extremity ROM/strength/stability.  Pelvic exam: deferred Fetal well being: 140 bpm baseline, moderate variability, + 10x10 accelerations, -decelerations Toco: initially having  regular uterine activity lasting 30-40 seconds, mild to palpation and patient unaware of them. By discharge no contractions noted.    Consults: None  Significant Findings/ Diagnostic Studies: labs:  Results for KENNDRA, MORRIS (MRN Brennan Bailey) as of 04/24/2020 13:45  Ref. Range 04/24/2020 10:51  Specimen source GC/Chlam Unknown PENDING  Chlamydia Tr Latest Ref Range: NOT DETECTED  NOT DETECTED  N gonorrhoeae Latest Ref Range: NOT DETECTED  NOT DETECTED  Yeast Wet Prep HPF POC Latest Ref Range: NONE SEEN  NONE SEEN  Trich, Wet Prep Latest Ref Range: NONE SEEN  NONE SEEN  Clue Cells Wet Prep HPF POC Latest Ref Range: NONE SEEN  NONE SEEN  WBC, Wet Prep HPF POC Latest Ref Range: NONE SEEN  MODERATE (A)  Appearance Latest Ref Range: CLEAR  CLEAR (A)  Bilirubin Urine Latest Ref Range: NEGATIVE  NEGATIVE  Color, Urine Latest Ref Range: YELLOW  COLORLESS (A)  Glucose, UA Latest Ref Range: NEGATIVE mg/dL NEGATIVE  Hgb urine dipstick Latest Ref Range: NEGATIVE  NEGATIVE  Ketones, ur Latest Ref Range: NEGATIVE mg/dL NEGATIVE  Leukocytes,Ua Latest Ref Range: NEGATIVE  NEGATIVE  Nitrite Latest Ref Range: NEGATIVE  NEGATIVE  pH Latest Ref Range: 5.0 - 8.0  7.0  Protein Latest Ref Range: NEGATIVE mg/dL NEGATIVE  Specific Gravity, Urine Latest Ref Range: 1.005 - 1.030  1.000 (L)  Bacteria, UA Latest Ref Range: NONE SEEN  RARE (A)  RBC / HPF Latest Ref Range: 0 - 5 RBC/hpf 0-5  Squamous Epithelial / LPF Latest Ref Range: 0 - 5  NONE SEEN  WBC, UA Latest Ref Range: 0 - 5 WBC/hpf NONE SEEN  URINE CULTURE Unknown Rpt  WET PREP, GENITAL Unknown Rpt (A)   Result Notes  Ref Range & Units 10:51 6 yr ago  Yeast Wet Prep HPF POC NONE SEEN NONE SEEN  NONE SEEN   Trich, Wet Prep NONE SEEN NONE SEEN  NONE SEEN   Clue Cells Wet Prep HPF POC NONE SEEN NONE SEEN  NONE SEEN   WBC, Wet Prep HPF POC NONE SEEN MODERATEAbnormal  MANYAbnormal CM   Sperm  NONE SEEN    Comment: Performed at [facility], [address] [place], [place] [address]  Resulting Agency  [facility]      Specimen Collected: 04/24/20 10:51 Last Resulted: 04/24/20 11:25         Procedures: NST  Hospital Course: The patient was admitted to Labor and Delivery Triage for observation.   Discharge Condition: good  Disposition: Discharge disposition: 01-Home or Self Care  Diet: Regular diet  Discharge Activity: Activity as tolerated  Discharge Instructions    Discharge activity:  No Restrictions   Complete by: As directed    Discharge diet:  No restrictions   Complete by: As directed    No sexual activity restrictions   Complete by: As directed    Notify physician for a general feeling that "something is not right"   Complete by: As directed    Notify physician for increase or change in vaginal discharge   Complete by: As directed    Notify physician for intestinal cramps, with or without diarrhea, sometimes described as "gas pain"   Complete by: As directed    Notify physician for leaking of fluid   Complete by: As directed    Notify physician for low, dull backache, unrelieved by heat or Tylenol   Complete by: As directed    Notify physician for menstrual like cramps   Complete by: As directed    Notify physician for pelvic pressure   Complete by: As directed    Notify physician for uterine contractions.  These may be painless and feel like the uterus is tightening or the baby is  "balling up"   Complete by: As directed    Notify physician for vaginal bleeding   Complete by: As directed    PRETERM LABOR:  Includes any of the follwing symptoms that occur between 20 - [redacted] weeks gestation.  If these symptoms are not stopped, preterm labor can result in preterm delivery, placing your baby at risk   Complete by: As directed      Allergies as of 04/24/2020   No Known Allergies     Medication List    STOP taking these medications   pantoprazole 20 MG tablet Commonly known as: Protonix      TAKE these medications   cyclobenzaprine 10 MG tablet Commonly known as: FLEXERIL Take 1 tablet (10 mg total) by mouth 2 (two) times daily as needed for muscle spasms.   levothyroxine 25 MCG tablet Commonly  known as: SYNTHROID Take 25 mcg by mouth daily.   PRENATAL ADULT GUMMY/DHA/FA PO Take by mouth.       Follow-up Information    [facility]. Go to.   Specialty: Obstetrics and Gynecology Why: scheduled prenatal appointments Contact information: [address] [place] [address] [phone]              Total time spent taking care of this patient: 30 minutes  Signed: Tresea Mall, CNM  04/24/2020, 1:38 PM

## 2020-04-24 NOTE — Telephone Encounter (Signed)
Adv pt to go to L&D via ED.  [place] notified.

## 2020-04-24 NOTE — Discharge Instructions (Signed)
Abdominal support band Heat/ice Stretches/exercise Epsom salt soak Pain gels/patch Tylenol Flexeril Massage/chiropractic/acupuncture

## 2020-04-24 NOTE — Discharge Summary (Signed)
RN reviewed discharge instructions with patient. Gave pt opportunity for questions. All questions answered at this time. Pt verbalized understanding. Pt discharged home. 

## 2020-04-24 NOTE — Telephone Encounter (Signed)
Can consider work in visit with provider in office, if available.  Otherwise, I would recommend L&D eval given risk of pyelonephritis.

## 2020-04-24 NOTE — OB Triage Note (Addendum)
Pt presents c/o right sided lower back pain that started last night. Pt states she tried everything she could think of to try to ease the pain and the pain is not any better this morning. Pt denies any bleeding or LOF. Reports positive fetal movement. Denies recent intercourse. Denies any burning or hurting when she urinates. Pt states she hasn't had any irregualr vaginal discharge either. Pt reports pain 7/10 on pain scale. Pt states the pain is sharp and constant. VSS. Will continue to monitor.

## 2020-04-26 LAB — URINE CULTURE: Culture: <10000 — AB

## 2020-05-03 ENCOUNTER — Encounter: Payer: Self-pay | Admitting: Certified Nurse Midwife

## 2020-05-03 ENCOUNTER — Other Ambulatory Visit: Payer: Self-pay

## 2020-05-03 ENCOUNTER — Ambulatory Visit (INDEPENDENT_AMBULATORY_CARE_PROVIDER_SITE_OTHER): Payer: 59 | Admitting: Certified Nurse Midwife

## 2020-05-03 ENCOUNTER — Other Ambulatory Visit: Payer: 59

## 2020-05-03 VITALS — BP 120/70 | Ht 62.0 in | Wt 173.2 lb

## 2020-05-03 DIAGNOSIS — O99283 Endocrine, nutritional and metabolic diseases complicating pregnancy, third trimester: Secondary | ICD-10-CM | POA: Diagnosis not present

## 2020-05-03 DIAGNOSIS — Z3480 Encounter for supervision of other normal pregnancy, unspecified trimester: Secondary | ICD-10-CM

## 2020-05-03 DIAGNOSIS — Z3A28 28 weeks gestation of pregnancy: Secondary | ICD-10-CM | POA: Diagnosis not present

## 2020-05-03 DIAGNOSIS — O99280 Endocrine, nutritional and metabolic diseases complicating pregnancy, unspecified trimester: Secondary | ICD-10-CM

## 2020-05-03 DIAGNOSIS — E039 Hypothyroidism, unspecified: Secondary | ICD-10-CM | POA: Diagnosis not present

## 2020-05-03 DIAGNOSIS — Z6791 Unspecified blood type, Rh negative: Secondary | ICD-10-CM

## 2020-05-03 DIAGNOSIS — O99810 Abnormal glucose complicating pregnancy: Secondary | ICD-10-CM

## 2020-05-03 DIAGNOSIS — O26899 Other specified pregnancy related conditions, unspecified trimester: Secondary | ICD-10-CM

## 2020-05-03 LAB — POCT URINALYSIS DIPSTICK OB
Glucose, UA: NEGATIVE
POC,PROTEIN,UA: NEGATIVE

## 2020-05-03 MED ORDER — RHO D IMMUNE GLOBULIN 1500 UNIT/2ML IJ SOSY
300.0000 ug | PREFILLED_SYRINGE | Freq: Once | INTRAMUSCULAR | Status: AC
  Administered 2020-05-03: 300 ug via INTRAMUSCULAR

## 2020-05-03 NOTE — Progress Notes (Signed)
ROB at 28wk2d: Doing well. Baby active. No vaginal bleeding or regular contractions. Wants to breast feed for the first 3 months prior to going back to work. Breast fed last baby for 3-4 months and her first baby for 1 week.  Is having 1 hour GTT/ TSH and free t4  today and needs Rhogam  EXam: FH 32cm. FHT 134cm. BP 120/70. TWG 11# (173#),  A negative blood type  A: IUP at 28wk2d S>D Rhogam candidate  P: Rhogam after blood work drawn The following were addressed during this visit:  Breastfeeding Education - Early initiation of breastfeeding    Comments: Keeps milk supply adequate, helps contract uterus and slow bleeding, and early milk is the perfect first food and is easy to digest.   - The importance of early skin-to-skin contact    Comments: Keeps baby warm and secure, helps keep baby's blood sugar up and breathing steady, easier to bond and breastfeed, and helps calm baby.  - Rooming-in on a 24-hour basis    Comments: Easier to learn baby's feeding cues, easier to bond and get to know each other, and encourages milk production.  Growth scan and ROB in 2 weeks. Discussed getting TDAP vaccine in the next 2-4 months.  Farrel Conners, CNM

## 2020-05-04 LAB — 28 WEEKS RH-PANEL
Antibody Screen: NEGATIVE
Basophils Absolute: 0 10*3/uL (ref 0.0–0.2)
Basos: 1 %
EOS (ABSOLUTE): 0.1 10*3/uL (ref 0.0–0.4)
Eos: 1 %
Gestational Diabetes Screen: 146 mg/dL — ABNORMAL HIGH (ref 65–139)
HIV Screen 4th Generation wRfx: NONREACTIVE
Hematocrit: 34 % (ref 34.0–46.6)
Hemoglobin: 11.3 g/dL (ref 11.1–15.9)
Immature Grans (Abs): 0.1 10*3/uL (ref 0.0–0.1)
Immature Granulocytes: 1 %
Lymphocytes Absolute: 1.5 10*3/uL (ref 0.7–3.1)
Lymphs: 19 %
MCH: 28.1 pg (ref 26.6–33.0)
MCHC: 33.2 g/dL (ref 31.5–35.7)
MCV: 85 fL (ref 79–97)
Monocytes Absolute: 0.4 10*3/uL (ref 0.1–0.9)
Monocytes: 5 %
Neutrophils Absolute: 5.7 10*3/uL (ref 1.4–7.0)
Neutrophils: 73 %
Platelets: 292 10*3/uL (ref 150–450)
RBC: 4.02 x10E6/uL (ref 3.77–5.28)
RDW: 12.6 % (ref 11.7–15.4)
RPR Ser Ql: NONREACTIVE
WBC: 7.8 10*3/uL (ref 3.4–10.8)

## 2020-05-05 LAB — TSH+FREE T4
Free T4: 1.29 ng/dL (ref 0.82–1.77)
TSH: 2.83 u[IU]/mL (ref 0.450–4.500)

## 2020-05-05 LAB — SPECIMEN STATUS REPORT

## 2020-05-07 ENCOUNTER — Telehealth: Payer: Self-pay | Admitting: Certified Nurse Midwife

## 2020-05-07 ENCOUNTER — Encounter: Payer: Self-pay | Admitting: Certified Nurse Midwife

## 2020-05-07 NOTE — Telephone Encounter (Signed)
Patient is scheduled for 05/18/20 for ultrasound and follow up. Please p[lace ultrasound order. Thank you!

## 2020-05-09 ENCOUNTER — Telehealth: Payer: Self-pay

## 2020-05-09 ENCOUNTER — Other Ambulatory Visit: Payer: Self-pay | Admitting: Obstetrics and Gynecology

## 2020-05-09 DIAGNOSIS — O99810 Abnormal glucose complicating pregnancy: Secondary | ICD-10-CM

## 2020-05-09 DIAGNOSIS — Z3480 Encounter for supervision of other normal pregnancy, unspecified trimester: Secondary | ICD-10-CM

## 2020-05-09 NOTE — Telephone Encounter (Signed)
Pt aware of results. Can you call pt and help get 3 hr gtt scheduled please?

## 2020-05-09 NOTE — Telephone Encounter (Signed)
-----   Message from Conard Novak, MD sent at 05/09/2020  2:03 PM EDT ----- She failed her 1 hour gtt. She will need a 3 hour gtt.  Please let her know. I'll place the order. Thank you

## 2020-05-09 NOTE — Telephone Encounter (Signed)
Patient is schedule for 04/20/20 for lab appointment

## 2020-05-09 NOTE — Progress Notes (Signed)
She failed her 1 hour gtt. She will need a 3 hour gtt.  Please let her know. I'll place the order. Thank you

## 2020-05-10 NOTE — Addendum Note (Signed)
Addended by: Farrel Conners on: 05/10/2020 09:53 AM   Modules accepted: Orders

## 2020-05-10 NOTE — Telephone Encounter (Signed)
done

## 2020-05-16 ENCOUNTER — Other Ambulatory Visit: Payer: 59

## 2020-05-16 ENCOUNTER — Other Ambulatory Visit: Payer: Self-pay

## 2020-05-16 DIAGNOSIS — O99810 Abnormal glucose complicating pregnancy: Secondary | ICD-10-CM

## 2020-05-16 DIAGNOSIS — Z3480 Encounter for supervision of other normal pregnancy, unspecified trimester: Secondary | ICD-10-CM | POA: Diagnosis not present

## 2020-05-17 LAB — GESTATIONAL GLUCOSE TOLERANCE
Glucose, Fasting: 92 mg/dL (ref 65–94)
Glucose, GTT - 1 Hour: 161 mg/dL (ref 65–179)
Glucose, GTT - 2 Hour: 140 mg/dL (ref 65–154)
Glucose, GTT - 3 Hour: 116 mg/dL (ref 65–139)

## 2020-05-18 ENCOUNTER — Ambulatory Visit (INDEPENDENT_AMBULATORY_CARE_PROVIDER_SITE_OTHER): Payer: 59 | Admitting: Obstetrics

## 2020-05-18 ENCOUNTER — Other Ambulatory Visit: Payer: Self-pay

## 2020-05-18 ENCOUNTER — Other Ambulatory Visit: Payer: 59

## 2020-05-18 ENCOUNTER — Ambulatory Visit (INDEPENDENT_AMBULATORY_CARE_PROVIDER_SITE_OTHER): Payer: 59

## 2020-05-18 ENCOUNTER — Encounter: Payer: 59 | Admitting: Certified Nurse Midwife

## 2020-05-18 VITALS — BP 100/66 | Wt 174.0 lb

## 2020-05-18 DIAGNOSIS — O99810 Abnormal glucose complicating pregnancy: Secondary | ICD-10-CM | POA: Diagnosis not present

## 2020-05-18 DIAGNOSIS — O99280 Endocrine, nutritional and metabolic diseases complicating pregnancy, unspecified trimester: Secondary | ICD-10-CM

## 2020-05-18 DIAGNOSIS — Z23 Encounter for immunization: Secondary | ICD-10-CM

## 2020-05-18 DIAGNOSIS — Z3A28 28 weeks gestation of pregnancy: Secondary | ICD-10-CM | POA: Diagnosis not present

## 2020-05-18 DIAGNOSIS — Z3A30 30 weeks gestation of pregnancy: Secondary | ICD-10-CM

## 2020-05-18 DIAGNOSIS — Z3483 Encounter for supervision of other normal pregnancy, third trimester: Secondary | ICD-10-CM

## 2020-05-18 DIAGNOSIS — Z3480 Encounter for supervision of other normal pregnancy, unspecified trimester: Secondary | ICD-10-CM

## 2020-05-18 DIAGNOSIS — E039 Hypothyroidism, unspecified: Secondary | ICD-10-CM

## 2020-05-18 DIAGNOSIS — Z3A31 31 weeks gestation of pregnancy: Secondary | ICD-10-CM

## 2020-05-18 LAB — POCT URINALYSIS DIPSTICK OB
Glucose, UA: NEGATIVE
POC,PROTEIN,UA: NEGATIVE

## 2020-05-18 NOTE — Progress Notes (Signed)
Routine Prenatal Care Visit  Subjective  Abigail Lowe is a 29 y.o. [phone] at [redacted]w[redacted]d being seen today for ongoing prenatal care.  She is currently monitored for the following issues for this high-risk pregnancy and has Depression; Generalized anxiety disorder; Supervision of other normal pregnancy, antepartum; History of gestational diabetes in prior pregnancy, currently pregnant; Hypothyroidism affecting pregnancy, antepartum; Labor and delivery, indication for care; Back pain affecting pregnancy in second trimester; and [redacted] weeks gestation of pregnancy on their problem list.  ----------------------------------------------------------------------------------- Patient reports no complaints.  Had a growth scan today. Hx of >8 lb babies. Doing well. Has named the baby "Anette Riedel"  Some round ligament pain noted, more pressure with this pregnancy.  .  .   Pincus Large Fluid denies.  ----------------------------------------------------------------------------------- The following portions of the patient's history were reviewed and updated as appropriate: allergies, current medications, past family history, past medical history, past social history, past surgical history and problem list. Problem list updated.  Objective  Blood pressure 100/66, weight 174 lb (78.9 kg), last menstrual period 10/12/2019, unknown if currently breastfeeding. Pregravid weight 162 lb (73.5 kg) Total Weight Gain 12 lb (5.443 kg) Urinalysis: Urine Protein Negative  Urine Glucose Negative  Fetal Status:         Growth at 62%, AC at 67%.  #lbs 14 oz, normal AFI.  General:  Alert, oriented and cooperative. Patient is in no acute distress.  Skin: Skin is warm and dry. No rash noted.   Cardiovascular: Normal heart rate noted  Respiratory: Normal respiratory effort, no problems with respiration noted  Abdomen: Soft, gravid, appropriate for gestational age.       Pelvic:  Cervical exam deferred        Extremities: Normal range of  motion.     Mental Status: Normal mood and affect. Normal behavior. Normal judgment and thought content.  Edingurgh score is 11. Discussed Assessment   29 y.o. [id number] at [redacted]w[redacted]d by  07/24/2020, by Ultrasound presenting for routine prenatal visit  Plan   Pregnancy#4 Problems (from 10/12/19 to present)    Problem Noted Resolved   Supervision of other normal pregnancy, antepartum 11/30/2019 by Vena Austria, MD No   Overview Addendum 05/18/2020  2:34 PM by Mirna Mires, CNM     Nursing Staff Provider  Office Location  [facility] Dating   7 wk [place]  Language  English Anatomy US  Normal   Flu Vaccine  07/2019 Genetic Screen  NIPS:diploid XY   AFP: neg    TDaP vaccine    Hgb A1C or  GTT Early hgbA1c: normal Third trimester : 146, passed 3 hr.  Rhogam   [x ] 28wks 7/15   LAB RESULTS   Feeding Plan Breast Blood Type A/Negative/-- (02/10 1601)   Contraception  Antibody Negative (02/10 1601)  Circumcision  Rubella 4.06 (02/10 1601)  Pediatrician   RPR Non Reactive (02/10 1601)   Support Person  HBsAg Negative (02/10 1601)   Prenatal Classes  HIV Non Reactive (02/10 1601)    Varicella immune  BTL Consent  GBS  (For PCN allergy, check sensitivities)        VBAC Consent  Pap  NIL 2021    Hgb Electro      CF      SMA               Previous Version   History of gestational diabetes in prior pregnancy, currently pregnant 11/30/2019 by Vena Austria, MD No   Hypothyroidism affecting pregnancy, antepartum  11/30/2019 by Vena Austria, MD No       Preterm labor symptoms and general obstetric precautions including but not limited to vaginal bleeding, contractions, leaking of fluid and fetal movement were reviewed in detail with the patient. Please refer to After Visit Summary for other counseling recommendations.  Discussed her mood hx- She was previously treated with Wellbutrin, but stopped with the pregnancy. Reviewed her Edinburgh score- she does not want medication at this  time. Invited to discuss further, and consider restarting once delivered. Growth scan reviewed with her and Casimiro Needle her husband.  Return in about 2 weeks (around 06/01/2020) for return OB.  Mirna Mires, CNM  05/18/2020 2:35 PM

## 2020-05-18 NOTE — Progress Notes (Signed)
ROB/TDAP and BT consent today- no concerns 

## 2020-06-01 ENCOUNTER — Ambulatory Visit (INDEPENDENT_AMBULATORY_CARE_PROVIDER_SITE_OTHER): Payer: 59 | Admitting: Advanced Practice Midwife

## 2020-06-01 ENCOUNTER — Encounter: Payer: Self-pay | Admitting: Advanced Practice Midwife

## 2020-06-01 ENCOUNTER — Other Ambulatory Visit: Payer: Self-pay

## 2020-06-01 VITALS — BP 130/80 | HR 117 | Wt 179.0 lb

## 2020-06-01 DIAGNOSIS — Z3480 Encounter for supervision of other normal pregnancy, unspecified trimester: Secondary | ICD-10-CM

## 2020-06-01 DIAGNOSIS — Z3483 Encounter for supervision of other normal pregnancy, third trimester: Secondary | ICD-10-CM

## 2020-06-01 DIAGNOSIS — Z3A32 32 weeks gestation of pregnancy: Secondary | ICD-10-CM

## 2020-06-01 DIAGNOSIS — I951 Orthostatic hypotension: Secondary | ICD-10-CM

## 2020-06-01 LAB — POCT URINALYSIS DIPSTICK OB
Glucose, UA: NEGATIVE
POC,PROTEIN,UA: NEGATIVE

## 2020-06-01 NOTE — Progress Notes (Signed)
Routine Prenatal Care Visit  Subjective  Abigail Lowe is a 29 y.o. [id number] at [redacted]w[redacted]d being seen today for ongoing prenatal care.  She is currently monitored for the following issues for this low-risk pregnancy and has Depression; Generalized anxiety disorder; Supervision of other normal pregnancy, antepartum; History of gestational diabetes in prior pregnancy, currently pregnant; Hypothyroidism affecting pregnancy, antepartum; Labor and delivery, indication for care; Back pain affecting pregnancy in second trimester; and [redacted] weeks gestation of pregnancy on their problem list.  ----------------------------------------------------------------------------------- Patient reports 4 episodes of "blacking out" this past week. She was at work 1 of the times and at home the others. She feels her heart beat fast and then has trouble catching her breath followed by a feeling of blacking out. She has not fallen and she usually lays down when this happens and then she feels better. She admits usually hydrating well. She denies palpitations, chest pain, fever, chills, feeling clammy. She has been sweating a lot during the pregnancy.   Contractions: Not present. Vag. Bleeding: None.  Movement: Present. Leaking Fluid denies.  ----------------------------------------------------------------------------------- The following portions of the patient's history were reviewed and updated as appropriate: allergies, current medications, past family history, past medical history, past social history, past surgical history and problem list. Problem list updated.  Objective  Blood pressure 130/80, pulse (!) 117, weight 179 lb (81.2 kg), last menstrual period 10/12/2019 BP repeat: 122/78  Pregravid weight 162 lb (73.5 kg) Total Weight Gain 17 lb (7.711 kg) Urinalysis: Urine Protein Negative  Urine Glucose Negative  Fetal Status: Fetal Heart Rate (bpm): 138 Fundal Height: 33 cm Movement: Present     General:  Alert, oriented  and cooperative. Patient is in no acute distress.  Skin: Skin is warm and dry. No rash noted.   Cardiovascular: Normal heart rate noted  Respiratory: Normal respiratory effort, no problems with respiration noted  Abdomen: Soft, gravid, appropriate for gestational age. Pain/Pressure: Absent     Pelvic:  Cervical exam deferred        Extremities: Normal range of motion.  Edema: None  Mental Status: Normal mood and affect. Normal behavior. Normal judgment and thought content.   Assessment   29 y.o. [id number] at [redacted]w[redacted]d by  07/24/2020, by Ultrasound presenting for routine prenatal visit  Plan   Pregnancy#4 Problems (from 10/12/19 to present)    Problem Noted Resolved   Supervision of other normal pregnancy, antepartum 11/30/2019 by Vena Austria, MD No   Overview Addendum 05/18/2020  2:34 PM by Mirna Mires, CNM     Nursing Staff Provider  Office Location  [facility] Dating   7 wk [place]  Language  English Anatomy US  Normal   Flu Vaccine  07/2019 Genetic Screen  NIPS:diploid XY   AFP: neg    TDaP vaccine    Hgb A1C or  GTT Early hgbA1c: normal Third trimester : 146, passed 3 hr.  Rhogam   [x ] 28wks 7/15   LAB RESULTS   Feeding Plan Breast Blood Type A/Negative/-- (02/10 1601)   Contraception  Antibody Negative (02/10 1601)  Circumcision  Rubella 4.06 (02/10 1601)  Pediatrician   RPR Non Reactive (02/10 1601)   Support Person  HBsAg Negative (02/10 1601)   Prenatal Classes  HIV Non Reactive (02/10 1601)    Varicella immune  BTL Consent  GBS  (For PCN allergy, check sensitivities)        VBAC Consent  Pap  NIL 2021    Hgb Electro  CF      SMA               Previous Version   History of gestational diabetes in prior pregnancy, currently pregnant 11/30/2019 by Vena Austria, MD No   Hypothyroidism affecting pregnancy, antepartum 11/30/2019 by Vena Austria, MD No    Episodes of blacking out: cardiology referral, increase hydration, rest this weekend, go to ER for  worsening symptoms.   Preterm labor symptoms and general obstetric precautions including but not limited to vaginal bleeding, contractions, leaking of fluid and fetal movement were reviewed in detail with the patient.   Return in about 2 weeks (around 06/15/2020) for rob.  Tresea Mall, CNM 06/01/2020 3:51 PM

## 2020-06-01 NOTE — Progress Notes (Signed)
ROB Blacked out/rapid heart rate

## 2020-06-06 ENCOUNTER — Ambulatory Visit: Payer: 59 | Admitting: Internal Medicine

## 2020-06-06 ENCOUNTER — Encounter: Payer: Self-pay | Admitting: Internal Medicine

## 2020-06-06 ENCOUNTER — Other Ambulatory Visit: Payer: Self-pay

## 2020-06-06 VITALS — BP 129/93 | HR 107 | Ht 62.0 in | Wt 179.0 lb

## 2020-06-06 DIAGNOSIS — R55 Syncope and collapse: Secondary | ICD-10-CM

## 2020-06-06 DIAGNOSIS — R0602 Shortness of breath: Secondary | ICD-10-CM

## 2020-06-06 DIAGNOSIS — R002 Palpitations: Secondary | ICD-10-CM

## 2020-06-06 NOTE — Patient Instructions (Signed)
Medication Instructions:  Your physician recommends that you continue on your current medications as directed. Please refer to the Current Medication list given to you today.  *If you need a refill on your cardiac medications before your next appointment, please call your pharmacy*   Lab Work: none If you have labs (blood work) drawn today and your tests are completely normal, you will receive your results only by: Marland Kitchen MyChart Message (if you have MyChart) OR . A paper copy in the mail If you have any lab test that is abnormal or we need to change your treatment, we will call you to review the results.   Testing/Procedures: 1- Your physician has requested that you have an echocardiogram. Echocardiography is a painless test that uses sound waves to create images of your heart. It provides your doctor with information about the size and shape of your heart and how well your heart's chambers and valves are working. This procedure takes approximately one hour. There are no restrictions for this procedure. You may get an IV, if needed, to receive an ultrasound enhancing agent through to better visualize your heart.   2- ZIO XT monitor for 14 day to be placed after the echo. Your physician has recommended that you wear a Zio monitor. This monitor is a medical device that records the heart's electrical activity. Doctors most often use these monitors to diagnose arrhythmias. Arrhythmias are problems with the speed or rhythm of the heartbeat. The monitor is a small device applied to your chest. You can wear one while you do your normal daily activities. While wearing this monitor if you have any symptoms to push the button and record what you felt. Once you have worn this monitor for the period of time provider prescribed (Usually 14 days), you will return the monitor device in the postage paid box. Once it is returned they will download the data collected and provide [place] with a report which the provider will  then review and we will call you with those results. Important tips:  1. Avoid showering during the first 24 hours of wearing the monitor. 2. Avoid excessive sweating to help maximize wear time. 3. Do not submerge the device, no hot tubs, and no swimming pools. 4. Keep any lotions or oils away from the patch. 5. After 24 hours you may shower with the patch on. Take brief showers with your back facing the shower head.  6. Do not remove patch once it has been placed because that will interrupt data and decrease adhesive wear time. 7. Push the button when you have any symptoms and write down what you were feeling. 8. Once you have completed wearing your monitor, remove and place into box which has postage paid and place in your outgoing mailbox.  9. If for some reason you have misplaced your box then call our office and we can provide another box and/or mail it off for you.  Follow-Up: At [facility], you and your health needs are our priority.  As part of our continuing mission to provide you with exceptional heart care, we have created designated Provider Care Teams.  These Care Teams include your primary Cardiologist (physician) and Advanced Practice Providers (APPs -  Physician Assistants and Nurse Practitioners) who all work together to provide you with the care you need, when you need it.  We recommend signing up for the patient portal called "MyChart".  Sign up information is provided on this After Visit Summary.  MyChart is used to  connect with patients for Virtual Visits (Telemedicine).  Patients are able to view lab/test results, encounter notes, upcoming appointments, etc.  Non-urgent messages can be sent to your provider as well.   To learn more about what you can do with MyChart, go to [email or web address].    Your next appointment:   1 month(s)  The format for your next appointment:   In Person  Provider:    You may see DR Cristal Deer END  or one of the following Advanced  Practice Providers on your designated Care Team:    Nicolasa Ducking, NP  Eula Listen, [name]  Marisue Ivan, [name]   Echocardiogram An echocardiogram is a procedure that uses painless sound waves (ultrasound) to produce an image of the heart. Images from an echocardiogram can provide important information about:  Signs of coronary artery disease (CAD).  Aneurysm detection. An aneurysm is a weak or damaged part of an artery wall that bulges out from the normal force of blood pumping through the body.  Heart size and shape. Changes in the size or shape of the heart can be associated with certain conditions, including heart failure, aneurysm, and CAD.  Heart muscle function.  Heart valve function.  Signs of a past heart attack.  Fluid buildup around the heart.  Thickening of the heart muscle.  A tumor or infectious growth around the heart valves. Tell a health care provider about:  Any allergies you have.  All medicines you are taking, including vitamins, herbs, eye drops, creams, and over-the-counter medicines.  Any blood disorders you have.  Any surgeries you have had.  Any medical conditions you have.  Whether you are pregnant or may be pregnant. What are the risks? Generally, this is a safe procedure. However, problems may occur, including:  Allergic reaction to dye (contrast) that may be used during the procedure. What happens before the procedure? No specific preparation is needed. You may eat and drink normally. What happens during the procedure?   An IV tube may be inserted into one of your veins.  You may receive contrast through this tube. A contrast is an injection that improves the quality of the pictures from your heart.  A gel will be applied to your chest.  A wand-like tool (transducer) will be moved over your chest. The gel will help to transmit the sound waves from the transducer.  The sound waves will harmlessly bounce off of your heart to  allow the heart images to be captured in real-time motion. The images will be recorded on a computer. The procedure may vary among health care providers and hospitals. What happens after the procedure?  You may return to your normal, everyday life, including diet, activities, and medicines, unless your health care provider tells you not to do that. Summary  An echocardiogram is a procedure that uses painless sound waves (ultrasound) to produce an image of the heart.  Images from an echocardiogram can provide important information about the size and shape of your heart, heart muscle function, heart valve function, and fluid buildup around your heart.  You do not need to do anything to prepare before this procedure. You may eat and drink normally.  After the echocardiogram is completed, you may return to your normal, everyday life, unless your health care provider tells you not to do that. This information is not intended to replace advice given to you by your health care provider. Make sure you discuss any questions you have with your health care provider. Document Revised:  01/27/2019 Document Reviewed: 11/08/2016 Elsevier Patient Education  2020 [employer].

## 2020-06-06 NOTE — Progress Notes (Signed)
New Outpatient Visit Date: 06/06/2020  Referring Provider: Tresea Mall, CNM [address] [place],  [place] [address]  Chief Complaint: Palpitations, shortness of breath, and "blacking out"  HPI:  Abigail Lowe is a 29 y.o. pregnant female ([redacted]w[redacted]d) who is being seen today for the evaluation of orthostatic hypotension at the request of Abigail Lowe. She has a history of hypothyroidism, gestational diabetes, and depression/anxiety.  She was seen by her nurse midwife last week and reported 4 episodes of "blacking out" over the preceding week.  She was also seen in the ED in early June with chest pain.  CTA of the chest at that time was negative for PE or other acute intrathoracic process.  Today, Abigail Lowe reports 5 episodes of near-syncope over the last 2 weeks.  The most recent episode occurred 3 days ago.  She typically notices sudden onset of palpitations and shortness of breath and lightheadedness.  She has never passed out completely but feels her vision getting dark.  Symptoms can happen when seated or standing.  She usually lies down when the episodes begin, with resolution of symptoms over the course of about 5 minutes.  She denies chest pain, orthopnea, PND, and edema.  Abigail Lowe has denies a history of prior cardiac disease or testing.  This is her 3rd pregnancy; she did not experience these symptoms during her first two pregnancies.  However, she notes intermittent lightheadedness and near-syncope during adolescence.  --------------------------------------------------------------------------------------------------  Cardiovascular History & Procedures: Cardiovascular Problems:  Syncope  Palpitations  Shortness of breath  Risk Factors:  None  Cath/PCI:  None  CV Surgery:  None  EP Procedures and Devices:  None  Non-Invasive Evaluation(s):  None  Recent CV Pertinent Labs: Lab Results  Component Value Date   CHOL 158 02/08/2018   HDL 50 02/08/2018   LDLCALC 95  02/08/2018   TRIG 63 02/08/2018   CHOLHDL 3.2 02/08/2018   BNP 45.8 03/23/2020   K 3.5 03/23/2020   K 3.7 01/04/2012   BUN 5 (L) 03/23/2020   BUN 9 03/23/2018   BUN 8 01/04/2012   CREATININE 0.65 03/23/2020   CREATININE 0.54 (L) 01/04/2012    --------------------------------------------------------------------------------------------------  Past Medical History:  Diagnosis Date  . Anxiety   . Depression   . Gestational diabetes   . Strep pharyngitis 12/15/2017    Past Surgical History:  Procedure Laterality Date  . CHOLECYSTECTOMY N/A 11/25/2016   Procedure: LAPAROSCOPIC CHOLECYSTECTOMY WITH INTRAOPERATIVE CHOLANGIOGRAM;  Surgeon: Manus Rudd, MD;  Location: [facility] OR;  Service: General;  Laterality: N/A;  . DILATION AND CURETTAGE OF UTERUS  11/2018   SAB  . INTRAUTERINE DEVICE (IUD) INSERTION      Current Meds  Medication Sig  . levothyroxine (SYNTHROID) 25 MCG tablet Take 25 mcg by mouth daily.  . Prenatal MV & Min w/FA-DHA (PRENATAL ADULT GUMMY/DHA/FA PO) Take by mouth.    Allergies: Patient has no known allergies.  Social History   Tobacco Use  . Smoking status: Never Smoker  . Smokeless tobacco: Never Used  Vaping Use  . Vaping Use: Never used  Substance Use Topics  . Alcohol use: No  . Drug use: No    Family History  Problem Relation Age of Onset  . Hypertension Father   . Hyperlipidemia Father   . Heart disease Maternal Grandfather   . Cancer Maternal Grandfather   . Cervical cancer Mother   . ADD / ADHD Brother   . Hypertension Maternal Grandmother   . Cervical cancer Maternal  Grandmother   . Cancer Paternal Grandfather   . Diabetes Paternal Grandfather   . Hyperlipidemia Paternal Grandfather     Review of Systems: A 12-system review of systems was performed and was negative except as noted in the HPI.  --------------------------------------------------------------------------------------------------  Physical Exam: BP (!) 129/93 (BP  Location: Right Arm, Patient Position: Sitting, Cuff Size: Normal)   Pulse (!) 107   Ht 5\' 2"  (1.575 m)   Wt 179 lb (81.2 kg)   LMP 10/12/2019 (Exact Date)   SpO2 99%   BMI 32.74 kg/m   Repeat BP: 128/87  General:  NAD. HEENT: No conjunctival pallor or scleral icterus. Facemask in place. Neck: Supple without lymphadenopathy, thyromegaly, JVD, or HJR. No carotid bruit. Lungs: Normal work of breathing. Clear to auscultation bilaterally without wheezes or crackles. Heart: Tachycardic but regular without murmurs, rubs, or gallops. Abd: Bowel sounds present. Firm abdomen with gravid uterus noted. Ext: No lower extremity edema. Radial, PT, and DP pulses are 2+ bilaterally Skin: Warm and dry without rash. Neuro: CNIII-XII intact. Strength and fine-touch sensation intact in upper and lower extremities bilaterally. Psych: Normal mood and affect.  EKG:  Sinus tachycardia (HR 107 bpm).  Otherwise, no significant abnormality.  Lab Results  Component Value Date   WBC 7.8 05/03/2020   HGB 11.3 05/03/2020   HCT 34.0 05/03/2020   MCV 85 05/03/2020   PLT 292 05/03/2020    Lab Results  Component Value Date   NA 134 (L) 03/23/2020   K 3.5 03/23/2020   CL 101 03/23/2020   CO2 22 03/23/2020   BUN 5 (L) 03/23/2020   CREATININE 0.65 03/23/2020   GLUCOSE 137 (H) 03/23/2020   ALT 34 03/23/2020    Lab Results  Component Value Date   CHOL 158 02/08/2018   HDL 50 02/08/2018   LDLCALC 95 02/08/2018   TRIG 63 02/08/2018   CHOLHDL 3.2 02/08/2018   Lab Results  Component Value Date   TSH 2.830 05/03/2020   T4TOTAL 11.9 11/30/2019   --------------------------------------------------------------------------------------------------  ASSESSMENT AND PLAN: Near syncope: Most likely due to transient orthostatic hypotension related to autonomic dysfunction exacerbated by fluid/hemodynamic shifts associated with pregnancy.  I have recommended that we obtain an echocardiogram and 14-day event  monitor to exclude structural abnormality as well as significant arrhythmia.  In the meantime, I have encouraged Abigail Lowe to remain well-hydrated and to wear compression stockings.  We discussed the importance of sitting/lying down were she to have additional episodes in order to prevent falls.  Shortness of breath and palpitations: Other than mild sinus tachycardia noted today, no significant abnormality seen on exam or EKG.  Recent CTA chest was negative for PE.  Absence of coronary artery calcification also noted.  As above, I have recommended that we obtain a 14-day event monitor and echocardiogram.  Of note, TSH, serum chemistries, and BNP were all normal during the last 2-3 months.  Gestational hypertension: Blood pressure borderline elevated today.  Continue follow-up and management per OB.  Follow-up: Return to clinic in 1 month.  01/28/2020, MD 06/06/2020 5:29 PM

## 2020-06-12 ENCOUNTER — Other Ambulatory Visit: Payer: 59

## 2020-06-13 ENCOUNTER — Telehealth: Payer: Self-pay

## 2020-06-13 NOTE — Telephone Encounter (Signed)
FMLA/DISABILITY form for [employer] filled out, signature obtained and given to BS for processing. 

## 2020-06-15 ENCOUNTER — Ambulatory Visit (INDEPENDENT_AMBULATORY_CARE_PROVIDER_SITE_OTHER): Payer: 59 | Admitting: Advanced Practice Midwife

## 2020-06-15 ENCOUNTER — Encounter: Payer: Self-pay | Admitting: Advanced Practice Midwife

## 2020-06-15 ENCOUNTER — Other Ambulatory Visit: Payer: Self-pay

## 2020-06-15 VITALS — BP 124/76 | Wt 181.0 lb

## 2020-06-15 DIAGNOSIS — Z3A34 34 weeks gestation of pregnancy: Secondary | ICD-10-CM

## 2020-06-15 DIAGNOSIS — Z3483 Encounter for supervision of other normal pregnancy, third trimester: Secondary | ICD-10-CM

## 2020-06-15 LAB — POCT URINALYSIS DIPSTICK OB: Glucose, UA: NEGATIVE

## 2020-06-15 NOTE — Progress Notes (Signed)
34 wk instructions/FKC's given. Some pressure. Irregular Braxton Hicks ctx.

## 2020-06-15 NOTE — Patient Instructions (Signed)
34 Week Obstetrics Instructions  PRE-REGISTRATION  Please plan to pre-register at the hospital during the next week.  You can complete the form only by visiting [email or web address]   If you have any questions concerning this please contact the main number at [facility] [phone]  SIGNS OF LABOR  There is never an absolute way for an individual to determine if they ar truly in labor unless they are seen in the office or at the hospital.  However, please follow these guidelines in order to determine if you may potentially be in labor:  A.  Contractions 5 minutes apart for an hour (first baby)  B. Contractions 5 minutes apart for 20-30 minutes (if you have had a baby)  C. If you have any doubts   -If A, B, or C occur, please go to the hospital to be seen in Labor & Delivery [facility] [phone]-the nurse will check you and contact us. Please report to the hospital through the Emergency Room entrance and they will transport you to labor and delivery.  SPONTANEOUS RUPTURE OF MEMBRANES ("Water Breaks")  This is usually evidenced by a gush of fluid from the vagina.  If this occurs during office hours (and you are uncertain if your water has just broken), schedule ana appointment to come into the office to be checked.  After office hours and on weekends, go to the labor and delivery unit where the nurse will check you and contact us.  VAGINAL BLEEDING  A.  "Bloody Show" - this is a thick blood streaked vaginal discharge.  It is normal and should cause no alarm.  B. Heavy vaginal bleeding (similar to that of a period or heavier).  If this occurs, you should go IMMEDIATELY to the labor and delivery unit.  WHEN TO CALL  1. Routine Questions: please make a list of questions you may have and merely ask them at your next visit in the office.  2. Urgent or Worrisome Questions:  During office hours, please  call us at [phone] to leave a message with the nurse and/or provider for assistance.  After hours, the answering system will direct you as to which provider is on call and if your concern requires that they be paged.  If so, follow the instructions on the voicemail and the provider will contact you back at their earliest convenience.  3. Labor or Rupture of Membranes:  This will require an examination.  Please contact the office to schedule an appointment for exam.  After office hours and on the weekends, go to the Labor and Delivery unit at the hospital where the nurse will check you and call us.  You do not need to notify us at night that you are coming.  INSURANCE REGULATIONS/HOSPITAL DISCHARGE It is very important that you understand that not all delivers are the same.  Typically, a vaginal delivery requires a 2 day stay at the hospital and a cesarean delivery requires 4 days in the hospital for recovery.  It is your responsibility to know what time frame your insurance suggests you stay in the hospital for coverage purposes.  However, one must keep in mind that providers use their own judgment and discretion when determining when an individual is able to be discharged.    
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
## 2020-06-29 ENCOUNTER — Other Ambulatory Visit: Payer: Self-pay

## 2020-06-29 ENCOUNTER — Other Ambulatory Visit (HOSPITAL_COMMUNITY)
Admission: RE | Admit: 2020-06-29 | Discharge: 2020-06-29 | Disposition: A | Discharge status: Home / Self Care | Payer: Medicaid Other | Source: Ambulatory Visit | Attending: Obstetrics and Gynecology | Admitting: Obstetrics and Gynecology

## 2020-06-29 ENCOUNTER — Ambulatory Visit (INDEPENDENT_AMBULATORY_CARE_PROVIDER_SITE_OTHER): Payer: 59 | Admitting: Obstetrics and Gynecology

## 2020-06-29 ENCOUNTER — Encounter: Payer: Self-pay | Admitting: Obstetrics and Gynecology

## 2020-06-29 VITALS — BP 106/70 | Ht 62.0 in | Wt 184.6 lb

## 2020-06-29 DIAGNOSIS — Z3A36 36 weeks gestation of pregnancy: Secondary | ICD-10-CM

## 2020-06-29 DIAGNOSIS — Z3480 Encounter for supervision of other normal pregnancy, unspecified trimester: Secondary | ICD-10-CM | POA: Insufficient documentation

## 2020-06-29 DIAGNOSIS — O99280 Endocrine, nutritional and metabolic diseases complicating pregnancy, unspecified trimester: Secondary | ICD-10-CM

## 2020-06-29 DIAGNOSIS — O26843 Uterine size-date discrepancy, third trimester: Secondary | ICD-10-CM

## 2020-06-29 DIAGNOSIS — E039 Hypothyroidism, unspecified: Secondary | ICD-10-CM

## 2020-06-29 NOTE — Progress Notes (Signed)
Routine Prenatal Care Visit  Subjective  Abigail Lowe is a 29 y.o. [id number] at [redacted]w[redacted]d being seen today for ongoing prenatal care.  She is currently monitored for the following issues for this low-risk pregnancy and has Depression; Generalized anxiety disorder; Supervision of other normal pregnancy, antepartum; History of gestational diabetes in prior pregnancy, currently pregnant; Hypothyroidism affecting pregnancy, antepartum; Labor and delivery, indication for care; Back pain affecting pregnancy in second trimester; Orthostatic hypotension; Near syncope; Shortness of breath; and Palpitations on their problem list.  ----------------------------------------------------------------------------------- Patient reports no complaints.   Contractions: Irregular. Vag. Bleeding: None.  Movement: Present. Denies leaking of fluid.  ----------------------------------------------------------------------------------- The following portions of the patient's history were reviewed and updated as appropriate: allergies, current medications, past family history, past medical history, past social history, past surgical history and problem list. Problem list updated.   Objective  Blood pressure 106/70, height 5\' 2"  (1.575 m), weight 184 lb 9.6 oz (83.7 kg), last menstrual period 10/12/2019, unknown if currently breastfeeding. Pregravid weight 162 lb (73.5 kg) Total Weight Gain 22 lb 9.6 oz (10.3 kg) Urinalysis:      Fetal Status: Fetal Heart Rate (bpm): 120 Fundal Height: 40 cm Movement: Present     General:  Alert, oriented and cooperative. Patient is in no acute distress.  Skin: Skin is warm and dry. No rash noted.   Cardiovascular: Normal heart rate noted  Respiratory: Normal respiratory effort, no problems with respiration noted  Abdomen: Soft, gravid, appropriate for gestational age. Pain/Pressure: Present     Pelvic:  Cervical exam deferred Dilation: 3 Effacement (%): 50 Station: -3  Extremities:  Normal range of motion.     Mental Status: Normal mood and affect. Normal behavior. Normal judgment and thought content.     Assessment   29 y.o. 26 at [redacted]w[redacted]d by  07/24/2020, by Ultrasound presenting for routine prenatal visit  Plan   Pregnancy#4 Problems (from 10/12/19 to present)    Problem Noted Resolved   Supervision of other normal pregnancy, antepartum 11/30/2019 by 01/28/2020, MD No   Overview Addendum 06/29/2020 12:32 PM by 08/29/2020, MD     Nursing Staff Provider  Office Location  [facility] Dating   7 wk Natale Milch  Language  English Anatomy US  Normal   Flu Vaccine  07/2019 Genetic Screen  NIPS:diploid XY   AFP: neg    TDaP vaccine   05/18/2020 Hgb A1C or  GTT Early hgbA1c: normal Third trimester : 146, passed 3 hr.  Rhogam   [x ] 28wks 7/15   LAB RESULTS   Feeding Plan Breast Blood Type A/Negative/-- (02/10 1601)   Contraception  desires tubal Antibody Negative (02/10 1601)  Circumcision  Rubella 4.06 (02/10 1601)  Pediatrician   RPR Non Reactive (02/10 1601)   Support Person  HBsAg Negative (02/10 1601)   Prenatal Classes  HIV Non Reactive (02/10 1601)    Varicella immune  BTL Consent Signed 06/29/2020 GBS  (For PCN allergy, check sensitivities)        VBAC Consent  Pap  NIL 2021    Hgb Electro      CF      SMA               Previous Version   History of gestational diabetes in prior pregnancy, currently pregnant 11/30/2019 by 01/28/2020, MD No   Hypothyroidism affecting pregnancy, antepartum 11/30/2019 by 01/28/2020, MD No      Encouraged COVID vaccination- provided with handout Signed  tubal consents GBS/ GC/ CT today Growth [place] next visit for uterine size date discrepancy   Gestational age appropriate obstetric precautions including but not limited to vaginal bleeding, contractions, leaking of fluid and fetal movement were reviewed in detail with the patient.    Return in about 1 week (around 07/06/2020) for ROB, growth and AFI  [place].  Natale Milch MD [facility], [facility] 06/29/2020, 12:33 PM

## 2020-06-29 NOTE — Patient Instructions (Signed)
Labor Induction  Labor induction is when steps are taken to cause a pregnant woman to begin the labor process. Most women go into labor on their own between 37 weeks and 42 weeks of pregnancy. When this does not happen or when there is a medical need for labor to begin, steps may be taken to induce labor. Labor induction causes a pregnant woman's uterus to contract. It also causes the cervix to soften (ripen), open (dilate), and thin out (efface). Usually, labor is not induced before 39 weeks of pregnancy unless there is a medical reason to do so. Your health care provider will determine if labor induction is needed. Before inducing labor, your health care provider will consider a number of factors, including:  Your medical condition and your baby's.  How many weeks along you are in your pregnancy.  How mature your baby's lungs are.  The condition of your cervix.  The position of your baby.  The size of your birth canal. What are some reasons for labor induction? Labor may be induced if:  Your health or your baby's health is at risk.  Your pregnancy is overdue by 1 week or more.  Your water breaks but labor does not start on its own.  There is a low amount of amniotic fluid around your baby. You may also choose (elect) to have labor induced at a certain time. Generally, elective labor induction is done no earlier than 39 weeks of pregnancy. What methods are used for labor induction? Methods used for labor induction include:  Prostaglandin medicine. This medicine starts contractions and causes the cervix to dilate and ripen. It can be taken by mouth (orally) or by being inserted into the vagina (suppository).  Inserting a small, thin tube (catheter) with a balloon into the vagina and then expanding the balloon with water to dilate the cervix.  Stripping the membranes. In this method, your health care provider gently separates amniotic sac tissue from the cervix. This causes the  cervix to stretch, which in turn causes the release of a hormone called progesterone. The hormone causes the uterus to contract. This procedure is often done during an office visit, after which you will be sent home to wait for contractions to begin.  Breaking the water. In this method, your health care provider uses a small instrument to make a small hole in the amniotic sac. This eventually causes the amniotic sac to break. Contractions should begin after a few hours.  Medicine to trigger or strengthen contractions. This medicine is given through an IV that is inserted into a vein in your arm. Except for membrane stripping, which can be done in a clinic, labor induction is done in the hospital so that you and your baby can be carefully monitored. How [place] does it take for labor to be induced? The length of time it takes to induce labor depends on how ready your body is for labor. Some inductions can take up to 2-3 days, while others may take less than a day. Induction may take longer if:  You are induced early in your pregnancy.  It is your first pregnancy.  Your cervix is not ready. What are some risks associated with labor induction? Some risks associated with labor induction include:  Changes in fetal heart rate, such as being too high, too low, or irregular (erratic).  Failed induction.  Infection in the mother or the baby.  Increased risk of having a cesarean delivery.  Fetal death.  Breaking off (abruption)   of the placenta from the uterus (rare).  Rupture of the uterus (very rare). When induction is needed for medical reasons, the benefits of induction generally outweigh the risks. What are some reasons for not inducing labor? Labor induction should not be done if:  Your baby does not tolerate contractions.  You have had previous surgeries on your uterus, such as a myomectomy, removal of fibroids, or a vertical scar from a previous cesarean delivery.  Your placenta lies  very low in your uterus and blocks the opening of the cervix (placenta previa).  Your baby is not in a head-down position.  The umbilical cord drops down into the birth canal in front of the baby.  There are unusual circumstances, such as the baby being very early (premature).  You have had more than 2 previous cesarean deliveries. Summary  Labor induction is when steps are taken to cause a pregnant woman to begin the labor process.  Labor induction causes a pregnant woman's uterus to contract. It also causes the cervix to ripen, dilate, and efface.  Labor is not induced before 39 weeks of pregnancy unless there is a medical reason to do so.  When induction is needed for medical reasons, the benefits of induction generally outweigh the risks. This information is not intended to replace advice given to you by your health care provider. Make sure you discuss any questions you have with your health care provider. Document Revised: 10/09/2017 Document Reviewed: 11/19/2016 Elsevier Patient Education  2020 [employer]   Vaginal Delivery  Vaginal delivery means that you give birth by pushing your baby out of your birth canal (vagina). A team of health care providers will help you before, during, and after vaginal delivery. Birth experiences are unique for every woman and every pregnancy, and birth experiences vary depending on where you choose to give birth. What happens when I arrive at the birth center or hospital? Once you are in labor and have been admitted into the hospital or birth center, your health care provider may:  Review your pregnancy history and any concerns that you have.  Insert an IV into one of your veins. This may be used to give you fluids and medicines.  Check your blood pressure, pulse, temperature, and heart rate (vital signs).  Check whether your bag of water (amniotic sac) has broken (ruptured).  Talk with you about your birth plan and discuss pain control  options. Monitoring Your health care provider may monitor your contractions (uterine monitoring) and your baby's heart rate (fetal monitoring). You may need to be monitored:  Often, but not continuously (intermittently).  All the time or for [place] periods at a time (continuously). Continuous monitoring may be needed if: ? You are taking certain medicines, such as medicine to relieve pain or make your contractions stronger. ? You have pregnancy or labor complications. Monitoring may be done by:  Placing a special stethoscope or a handheld monitoring device on your abdomen to check your baby's heartbeat and to check for contractions.  Placing monitors on your abdomen (external monitors) to record your baby's heartbeat and the frequency and length of contractions.  Placing monitors inside your uterus through your vagina (internal monitors) to record your baby's heartbeat and the frequency, length, and strength of your contractions. Depending on the type of monitor, it may remain in your uterus or on your baby's head until birth.  Telemetry. This is a type of continuous monitoring that can be done with external or internal monitors. Instead of   having to stay in bed, you are able to move around during telemetry. Physical exam Your health care provider may perform frequent physical exams. This may include:  Checking how and where your baby is positioned in your uterus.  Checking your cervix to determine: ? Whether it is thinning out (effacing). ? Whether it is opening up (dilating). What happens during labor and delivery?  Normal labor and delivery is divided into the following three stages: Stage 1  This is the longest stage of labor.  This stage can last for hours or days.  Throughout this stage, you will feel contractions. Contractions generally feel mild, infrequent, and irregular at first. They get stronger, more frequent (about every 2-3 minutes), and more regular as you move  through this stage.  This stage ends when your cervix is completely dilated to 4 inches (10 cm) and completely effaced. Stage 2  This stage starts once your cervix is completely effaced and dilated and lasts until the delivery of your baby.  This stage may last from 20 minutes to 2 hours.  This is the stage where you will feel an urge to push your baby out of your vagina.  You may feel stretching and burning pain, especially when the widest part of your baby's head passes through the vaginal opening (crowning).  Once your baby is delivered, the umbilical cord will be clamped and cut. This usually occurs after waiting a period of 1-2 minutes after delivery.  Your baby will be placed on your bare chest (skin-to-skin contact) in an upright position and covered with a warm blanket. Watch your baby for feeding cues, like rooting or sucking, and help the baby to your breast for his or her first feeding. Stage 3  This stage starts immediately after the birth of your baby and ends after you deliver the placenta.  This stage may take anywhere from 5 to 30 minutes.  After your baby has been delivered, you will feel contractions as your body expels the placenta and your uterus contracts to control bleeding. What can I expect after labor and delivery?  After labor is over, you and your baby will be monitored closely until you are ready to go home to ensure that you are both healthy. Your health care team will teach you how to care for yourself and your baby.  You and your baby will stay in the same room (rooming in) during your hospital stay. This will encourage early bonding and successful breastfeeding.  You may continue to receive fluids and medicines through an IV.  Your uterus will be checked and massaged regularly (fundal massage).  You will have some soreness and pain in your abdomen, vagina, and the area of skin between your vaginal opening and your anus (perineum).  If an incision was  made near your vagina (episiotomy) or if you had some vaginal tearing during delivery, cold compresses may be placed on your episiotomy or your tear. This helps to reduce pain and swelling.  You may be given a squirt bottle to use instead of wiping when you go to the bathroom. To use the squirt bottle, follow these steps: ? Before you urinate, fill the squirt bottle with warm water. Do not use hot water. ? After you urinate, while you are sitting on the toilet, use the squirt bottle to rinse the area around your urethra and vaginal opening. This rinses away any urine and blood. ? Fill the squirt bottle with clean water every time you use the   bathroom.  It is normal to have vaginal bleeding after delivery. Wear a sanitary pad for vaginal bleeding and discharge. Summary  Vaginal delivery means that you will give birth by pushing your baby out of your birth canal (vagina).  Your health care provider may monitor your contractions (uterine monitoring) and your baby's heart rate (fetal monitoring).  Your health care provider may perform a physical exam.  Normal labor and delivery is divided into three stages.  After labor is over, you and your baby will be monitored closely until you are ready to go home. This information is not intended to replace advice given to you by your health care provider. Make sure you discuss any questions you have with your health care provider. Document Revised: 11/10/2017 Document Reviewed: 11/10/2017 Elsevier Patient Education  2020 [employer].

## 2020-07-03 LAB — CERVICOVAGINAL ANCILLARY ONLY
Chlamydia: NEGATIVE
Comment: NEGATIVE
Comment: NORMAL
Neisseria Gonorrhea: NEGATIVE

## 2020-07-03 LAB — CULTURE, BETA STREP (GROUP B ONLY): Strep Gp B Culture: NEGATIVE

## 2020-07-06 ENCOUNTER — Ambulatory Visit (INDEPENDENT_AMBULATORY_CARE_PROVIDER_SITE_OTHER): Payer: 59

## 2020-07-06 ENCOUNTER — Encounter: Payer: Self-pay | Admitting: Obstetrics and Gynecology

## 2020-07-06 ENCOUNTER — Other Ambulatory Visit: Payer: Self-pay

## 2020-07-06 ENCOUNTER — Ambulatory Visit (INDEPENDENT_AMBULATORY_CARE_PROVIDER_SITE_OTHER): Payer: Medicaid Other | Admitting: Obstetrics and Gynecology

## 2020-07-06 VITALS — BP 120/80 | Wt 188.0 lb

## 2020-07-06 DIAGNOSIS — Z3A38 38 weeks gestation of pregnancy: Secondary | ICD-10-CM

## 2020-07-06 DIAGNOSIS — O368130 Decreased fetal movements, third trimester, not applicable or unspecified: Secondary | ICD-10-CM

## 2020-07-06 DIAGNOSIS — Z8632 Personal history of gestational diabetes: Secondary | ICD-10-CM | POA: Diagnosis not present

## 2020-07-06 DIAGNOSIS — E039 Hypothyroidism, unspecified: Secondary | ICD-10-CM | POA: Diagnosis not present

## 2020-07-06 DIAGNOSIS — Z3483 Encounter for supervision of other normal pregnancy, third trimester: Secondary | ICD-10-CM | POA: Diagnosis not present

## 2020-07-06 DIAGNOSIS — O26843 Uterine size-date discrepancy, third trimester: Secondary | ICD-10-CM

## 2020-07-06 DIAGNOSIS — O99283 Endocrine, nutritional and metabolic diseases complicating pregnancy, third trimester: Secondary | ICD-10-CM

## 2020-07-06 DIAGNOSIS — Z3A37 37 weeks gestation of pregnancy: Secondary | ICD-10-CM

## 2020-07-06 DIAGNOSIS — O09299 Supervision of pregnancy with other poor reproductive or obstetric history, unspecified trimester: Secondary | ICD-10-CM

## 2020-07-06 NOTE — Progress Notes (Signed)
Routine Prenatal Care Visit  Subjective  Abigail Lowe is a 29 y.o. [phone] at [redacted]w[redacted]d being seen today for ongoing prenatal care.  She is currently monitored for the following issues for this high-risk pregnancy and has Depression; Generalized anxiety disorder; Supervision of other normal pregnancy, antepartum; History of gestational diabetes in prior pregnancy, currently pregnant; Hypothyroidism affecting pregnancy, antepartum; Labor and delivery, indication for care; Back pain affecting pregnancy in second trimester; Orthostatic hypotension; Near syncope; Shortness of breath; and Palpitations on their problem list.  ----------------------------------------------------------------------------------- Patient reports no complaints.   Contractions: Irregular. Vag. Bleeding: None.  Movement: (!) Decreased. Leaking Fluid denies.  U/S today shows normal growth and AFI (see below) ----------------------------------------------------------------------------------- The following portions of the patient's history were reviewed and updated as appropriate: allergies, current medications, past family history, past medical history, past social history, past surgical history and problem list. Problem list updated.  Objective  Blood pressure 120/80, weight 188 lb (85.3 kg), last menstrual period 10/12/2019, unknown if currently breastfeeding. Pregravid weight 162 lb (73.5 kg) Total Weight Gain 26 lb (11.8 kg) Urinalysis: Urine Protein    Urine Glucose    Fetal Status: Fetal Heart Rate (bpm): 131 ([place]) Fundal Height: 40 cm Movement: (!) Decreased  Presentation: Vertex  General:  Alert, oriented and cooperative. Patient is in no acute distress.  Skin: Skin is warm and dry. No rash noted.   Cardiovascular: Normal heart rate noted  Respiratory: Normal respiratory effort, no problems with respiration noted  Abdomen: Soft, gravid, appropriate for gestational age. Pain/Pressure: Present     Pelvic:  Cervical exam  deferred        Extremities: Normal range of motion.  Edema: None  Mental Status: Normal mood and affect. Normal behavior. Normal judgment and thought content.   NST: Baseline FHR: 130 beats/min Variability: moderate Accelerations: present Decelerations: absent Tocometry: not done  Interpretation:  INDICATIONS: decreased fetal movement RESULTS:  A NST procedure was performed with FHR monitoring and a normal baseline established, appropriate time of 20-40 minutes of evaluation, and accels >2 seen w 15x15 characteristics.  Results show a REACTIVE NST.    Imaging Results US OB Follow Up  Result Date: 07/06/2020 Patient Name: Abigail Lowe DOB: 27-Nov-1990 MRN: [id number] ULTRASOUND REPORT Location: [facility] Date of Service: 07/06/2020 Indications:growth/afi Findings: Abigail Lowe intrauterine pregnancy is visualized with FHR at 131 BPM. Biometrics give an (U/S) Gestational age of [redacted]w[redacted]d and an (U/S) EDD of 07/20/2020; this correlates with the clinically established Estimated Date of Delivery: 07/24/2020. Fetal presentation is Cephalic. Placenta: anterior. Grade: 2 AFI: 13.9 cm Growth percentile is 76.9%.  AC percentile is 93.5%. EFW: 3,499 g  (7 lb 11 oz) Impression: 1. [redacted]w[redacted]d Viable Singleton Intrauterine pregnancy previously established criteria. 2. Growth is 76.9 %ile.  AFI is 13.9 cm. Deanna Artis, RT The ultrasound images and findings were reviewed by me and I agree with the above report. Thomasene Mohair, MD, [facility], [facility] 07/06/2020 1:29 PM      Assessment   29 y.o. [id number] at [redacted]w[redacted]d by  07/24/2020, by Ultrasound presenting for routine prenatal visit  Plan   Pregnancy#4 Problems (from 10/12/19 to present)    Problem Noted Resolved   Supervision of other normal pregnancy, antepartum 11/30/2019 by Vena Austria, MD No   Overview Addendum 06/29/2020 12:32 PM by Natale Milch, MD     Nursing Staff Provider  Office Location  [facility] Dating    7 wk [place]  Language  English Anatomy US  Normal   Flu Vaccine  07/2019 Genetic Screen  NIPS:diploid XY   AFP: neg    TDaP vaccine   05/18/2020 Hgb A1C or  GTT Early hgbA1c: normal Third trimester : 146, passed 3 hr.  Rhogam   [x ] 28wks 7/15   LAB RESULTS   Feeding Plan Breast Blood Type A/Negative/-- (02/10 1601)   Contraception  desires tubal Antibody Negative (02/10 1601)  Circumcision  Rubella 4.06 (02/10 1601)  Pediatrician   RPR Non Reactive (02/10 1601)   Support Person  HBsAg Negative (02/10 1601)   Prenatal Classes  HIV Non Reactive (02/10 1601)    Varicella immune  BTL Consent Signed 06/29/2020 GBS  (For PCN allergy, check sensitivities)        VBAC Consent  Pap  NIL 2021    Hgb Electro      CF      SMA               Previous Version   History of gestational diabetes in prior pregnancy, currently pregnant 11/30/2019 by Vena Austria, MD No   Hypothyroidism affecting pregnancy, antepartum 11/30/2019 by Vena Austria, MD No       Term labor symptoms and general obstetric precautions including but not limited to vaginal bleeding, contractions, leaking of fluid and fetal movement were reviewed in detail with the patient. Please refer to After Visit Summary for other counseling recommendations.   Return in about 1 week (around 07/13/2020) for Routine Prenatal Appointment.  Thomasene Mohair, MD, [facility], [facility] 07/06/2020 2:18 PM

## 2020-07-13 ENCOUNTER — Encounter: Payer: Self-pay | Admitting: Obstetrics and Gynecology

## 2020-07-13 ENCOUNTER — Other Ambulatory Visit: Payer: Self-pay

## 2020-07-13 ENCOUNTER — Ambulatory Visit (INDEPENDENT_AMBULATORY_CARE_PROVIDER_SITE_OTHER): Payer: 59 | Admitting: Obstetrics and Gynecology

## 2020-07-13 VITALS — BP 122/74 | Wt 186.0 lb

## 2020-07-13 DIAGNOSIS — O09299 Supervision of pregnancy with other poor reproductive or obstetric history, unspecified trimester: Secondary | ICD-10-CM

## 2020-07-13 DIAGNOSIS — Z8632 Personal history of gestational diabetes: Secondary | ICD-10-CM

## 2020-07-13 DIAGNOSIS — O99283 Endocrine, nutritional and metabolic diseases complicating pregnancy, third trimester: Secondary | ICD-10-CM

## 2020-07-13 DIAGNOSIS — Z3483 Encounter for supervision of other normal pregnancy, third trimester: Secondary | ICD-10-CM

## 2020-07-13 DIAGNOSIS — E039 Hypothyroidism, unspecified: Secondary | ICD-10-CM

## 2020-07-13 DIAGNOSIS — Z3A38 38 weeks gestation of pregnancy: Secondary | ICD-10-CM

## 2020-07-13 NOTE — Patient Instructions (Signed)
Induction of Labor on 07/17/2020 at 0800 AM  1) Please get COVID testing on 9/27 between 9-10 at the [facility] building drive up.  Stay in your car and wear a mask. This should be a quick test. Quarantine after this test prior to coming to the hospital.  2) On 9/28 at 0800AM go through the ER entrance and check in and you'll be taken to labor and delivery.

## 2020-07-13 NOTE — Progress Notes (Signed)
Routine Prenatal Care Visit  Subjective  Abigail Lowe is a 29 y.o. [phone] at [redacted]w[redacted]d being seen today for ongoing prenatal care.  She is currently monitored for the following issues for this low-risk pregnancy and has Depression; Generalized anxiety disorder; Supervision of other normal pregnancy, antepartum; History of gestational diabetes in prior pregnancy, currently pregnant; Hypothyroidism affecting pregnancy, antepartum; Labor and delivery, indication for care; Back pain affecting pregnancy in second trimester; Orthostatic hypotension; Near syncope; Shortness of breath; and Palpitations on their problem list.  ----------------------------------------------------------------------------------- Patient reports no complaints.   Contractions: Irregular. Vag. Bleeding: None.  Movement: Present. Leaking Fluid denies.  ----------------------------------------------------------------------------------- The following portions of the patient's history were reviewed and updated as appropriate: allergies, current medications, past family history, past medical history, past social history, past surgical history and problem list. Problem list updated.  Objective  Blood pressure 122/74, weight 186 lb (84.4 kg), last menstrual period 10/12/2019, unknown if currently breastfeeding. Pregravid weight 162 lb (73.5 kg) Total Weight Gain 24 lb (10.9 kg) Urinalysis: Urine Protein    Urine Glucose    Fetal Status: Fetal Heart Rate (bpm): 155 Fundal Height: 39 cm Movement: Present  Presentation: Vertex  General:  Alert, oriented and cooperative. Patient is in no acute distress.  Skin: Skin is warm and dry. No rash noted.   Cardiovascular: Normal heart rate noted  Respiratory: Normal respiratory effort, no problems with respiration noted  Abdomen: Soft, gravid, appropriate for gestational age. Pain/Pressure: Present     Pelvic:  Cervical exam performed Dilation: 3.5 Effacement (%): 50 Station: -3  Extremities:  Normal range of motion.  Edema: None  Mental Status: Normal mood and affect. Normal behavior. Normal judgment and thought content.   Assessment   29 y.o. [id number] at [redacted]w[redacted]d by  07/24/2020, by Ultrasound presenting for routine prenatal visit  Plan   Pregnancy#4 Problems (from 10/12/19 to present)    Problem Noted Resolved   Supervision of other normal pregnancy, antepartum 11/30/2019 by Vena Austria, MD No   Overview Addendum 06/29/2020 12:32 PM by Natale Milch, MD     Nursing Staff Provider  Office Location  [facility] Dating   7 wk [place]  Language  English Anatomy US  Normal   Flu Vaccine  07/2019 Genetic Screen  NIPS:diploid XY   AFP: neg    TDaP vaccine   05/18/2020 Hgb A1C or  GTT Early hgbA1c: normal Third trimester : 146, passed 3 hr.  Rhogam   [x ] 28wks 7/15   LAB RESULTS   Feeding Plan Breast Blood Type A/Negative/-- (02/10 1601)   Contraception  desires tubal Antibody Negative (02/10 1601)  Circumcision  Rubella 4.06 (02/10 1601)  Pediatrician   RPR Non Reactive (02/10 1601)   Support Person  HBsAg Negative (02/10 1601)   Prenatal Classes  HIV Non Reactive (02/10 1601)    Varicella immune  BTL Consent Signed 06/29/2020 GBS  (For PCN allergy, check sensitivities)        VBAC Consent  Pap  NIL 2021    Hgb Electro      CF      SMA               Previous Version   History of gestational diabetes in prior pregnancy, currently pregnant 11/30/2019 by Vena Austria, MD No   Hypothyroidism affecting pregnancy, antepartum 11/30/2019 by Vena Austria, MD No       Term labor symptoms and general obstetric precautions including but not limited to vaginal bleeding, contractions, leaking  of fluid and fetal movement were reviewed in detail with the patient. Please refer to After Visit Summary for other counseling recommendations.   - IOL on 9/28 at 0800.   Return in about 4 days (around 07/17/2020) for Induction of labor.  Thomasene Mohair, MD, [facility], [facility] 07/13/2020 2:30 PM

## 2020-07-16 ENCOUNTER — Other Ambulatory Visit
Admission: RE | Admit: 2020-07-16 | Discharge: 2020-07-16 | Disposition: A | Discharge status: Home / Self Care | Payer: Medicaid Other | Source: Ambulatory Visit | Attending: Obstetrics and Gynecology | Admitting: Obstetrics and Gynecology

## 2020-07-16 ENCOUNTER — Other Ambulatory Visit: Payer: Self-pay

## 2020-07-16 DIAGNOSIS — Z01812 Encounter for preprocedural laboratory examination: Secondary | ICD-10-CM | POA: Insufficient documentation

## 2020-07-16 DIAGNOSIS — Z20822 Contact with and (suspected) exposure to covid-19: Secondary | ICD-10-CM | POA: Insufficient documentation

## 2020-07-16 LAB — SARS CORONAVIRUS 2 (TAT 6-24 HRS): SARS Coronavirus 2: NEGATIVE

## 2020-07-17 ENCOUNTER — Inpatient Hospital Stay: Payer: Medicaid Other | Admitting: Anesthesiology

## 2020-07-17 ENCOUNTER — Encounter: Payer: Self-pay | Admitting: Obstetrics and Gynecology

## 2020-07-17 ENCOUNTER — Inpatient Hospital Stay
Admission: EM | Admit: 2020-07-17 | Discharge: 2020-07-18 | DRG: 797 | Disposition: A | Discharge status: Home / Self Care | Payer: Medicaid Other | Attending: Obstetrics and Gynecology | Admitting: Obstetrics and Gynecology

## 2020-07-17 ENCOUNTER — Other Ambulatory Visit: Payer: Self-pay

## 2020-07-17 DIAGNOSIS — E039 Hypothyroidism, unspecified: Secondary | ICD-10-CM | POA: Diagnosis present

## 2020-07-17 DIAGNOSIS — O99284 Endocrine, nutritional and metabolic diseases complicating childbirth: Secondary | ICD-10-CM | POA: Diagnosis present

## 2020-07-17 DIAGNOSIS — Z23 Encounter for immunization: Secondary | ICD-10-CM

## 2020-07-17 DIAGNOSIS — Z20822 Contact with and (suspected) exposure to covid-19: Secondary | ICD-10-CM | POA: Diagnosis present

## 2020-07-17 DIAGNOSIS — Z3490 Encounter for supervision of normal pregnancy, unspecified, unspecified trimester: Secondary | ICD-10-CM | POA: Diagnosis present

## 2020-07-17 DIAGNOSIS — O9081 Anemia of the puerperium: Secondary | ICD-10-CM | POA: Diagnosis not present

## 2020-07-17 DIAGNOSIS — O09299 Supervision of pregnancy with other poor reproductive or obstetric history, unspecified trimester: Secondary | ICD-10-CM

## 2020-07-17 DIAGNOSIS — D62 Acute posthemorrhagic anemia: Secondary | ICD-10-CM | POA: Diagnosis not present

## 2020-07-17 DIAGNOSIS — Z302 Encounter for sterilization: Secondary | ICD-10-CM

## 2020-07-17 DIAGNOSIS — Z3480 Encounter for supervision of other normal pregnancy, unspecified trimester: Secondary | ICD-10-CM

## 2020-07-17 DIAGNOSIS — Z3A39 39 weeks gestation of pregnancy: Secondary | ICD-10-CM

## 2020-07-17 DIAGNOSIS — O99280 Endocrine, nutritional and metabolic diseases complicating pregnancy, unspecified trimester: Secondary | ICD-10-CM

## 2020-07-17 DIAGNOSIS — O26893 Other specified pregnancy related conditions, third trimester: Secondary | ICD-10-CM | POA: Diagnosis present

## 2020-07-17 DIAGNOSIS — Z8632 Personal history of gestational diabetes: Secondary | ICD-10-CM

## 2020-07-17 HISTORY — DX: Hypothyroidism, unspecified: E03.9

## 2020-07-17 LAB — CBC
HCT: 33.5 % — ABNORMAL LOW (ref 36.0–46.0)
Hemoglobin: 10.7 g/dL — ABNORMAL LOW (ref 12.0–15.0)
MCH: 25.8 pg — ABNORMAL LOW (ref 26.0–34.0)
MCHC: 31.9 g/dL (ref 30.0–36.0)
MCV: 80.9 fL (ref 80.0–100.0)
Platelets: 195 10*3/uL (ref 150–400)
RBC: 4.14 MIL/uL (ref 3.87–5.11)
RDW: 15.6 % — ABNORMAL HIGH (ref 11.5–15.5)
WBC: 8 10*3/uL (ref 4.0–10.5)
nRBC: 0 % (ref 0.0–0.2)

## 2020-07-17 LAB — TYPE AND SCREEN
ABO/RH(D): A NEG
Antibody Screen: POSITIVE

## 2020-07-17 LAB — ABO/RH: ABO/RH(D): A NEG

## 2020-07-17 MED ORDER — ACETAMINOPHEN 325 MG PO TABS
650.0000 mg | ORAL_TABLET | ORAL | Status: DC | PRN
  Administered 2020-07-17: 650 mg via ORAL

## 2020-07-17 MED ORDER — MISOPROSTOL 25 MCG QUARTER TABLET
25.0000 ug | ORAL_TABLET | Freq: Once | ORAL | Status: AC
  Administered 2020-07-17: 25 ug via BUCCAL

## 2020-07-17 MED ORDER — OXYTOCIN 10 UNIT/ML IJ SOLN
INTRAMUSCULAR | Status: AC
  Filled 2020-07-17: qty 2

## 2020-07-17 MED ORDER — ACETAMINOPHEN 325 MG PO TABS
650.0000 mg | ORAL_TABLET | ORAL | Status: DC | PRN
  Filled 2020-07-17: qty 2

## 2020-07-17 MED ORDER — EPHEDRINE 5 MG/ML INJ: 10.0000 mg | INTRAVENOUS | Status: DC | PRN

## 2020-07-17 MED ORDER — LIDOCAINE HCL (PF) 1 % IJ SOLN
INTRAMUSCULAR | Status: DC | PRN
  Administered 2020-07-17: 3 mL

## 2020-07-17 MED ORDER — FENTANYL 2.5 MCG/ML W/ROPIVACAINE 0.15% IN NS 100 ML EPIDURAL (ARMC)
EPIDURAL | Status: DC | PRN
Start: 2020-07-17 — End: 2020-07-17
  Administered 2020-07-17: 12 mL/h via EPIDURAL

## 2020-07-17 MED ORDER — FENTANYL 2.5 MCG/ML W/ROPIVACAINE 0.15% IN NS 100 ML EPIDURAL (ARMC)
EPIDURAL | Status: AC
Start: 2020-07-17 — End: 2020-07-17
  Filled 2020-07-17: qty 100

## 2020-07-17 MED ORDER — LIDOCAINE-EPINEPHRINE (PF) 1.5 %-1:200000 IJ SOLN
INTRAMUSCULAR | Status: DC | PRN
  Administered 2020-07-17: 3 mL via PERINEURAL

## 2020-07-17 MED ORDER — AMMONIA AROMATIC IN INHA
RESPIRATORY_TRACT | Status: AC
  Filled 2020-07-17: qty 10

## 2020-07-17 MED ORDER — LIDOCAINE HCL (PF) 1 % IJ SOLN
INTRAMUSCULAR | Status: AC
  Filled 2020-07-17: qty 30

## 2020-07-17 MED ORDER — ONDANSETRON HCL 4 MG/2ML IJ SOLN
4.0000 mg | Freq: Four times a day (QID) | INTRAMUSCULAR | Status: DC | PRN
  Filled 2020-07-17: qty 2

## 2020-07-17 MED ORDER — MISOPROSTOL 25 MCG QUARTER TABLET
ORAL_TABLET | ORAL | Status: AC
  Filled 2020-07-17: qty 2

## 2020-07-17 MED ORDER — BUPIVACAINE HCL (PF) 0.25 % IJ SOLN
INTRAMUSCULAR | Status: DC | PRN
  Administered 2020-07-17: 5 mL via EPIDURAL
  Administered 2020-07-17: 3 mL via EPIDURAL

## 2020-07-17 MED ORDER — LACTATED RINGERS IV SOLN: INTRAVENOUS | Status: DC

## 2020-07-17 MED ORDER — PHENYLEPHRINE 40 MCG/ML (10ML) SYRINGE FOR IV PUSH (FOR BLOOD PRESSURE SUPPORT): 80.0000 ug | PREFILLED_SYRINGE | INTRAVENOUS | Status: DC | PRN

## 2020-07-17 MED ORDER — MISOPROSTOL 25 MCG QUARTER TABLET
25.0000 ug | ORAL_TABLET | ORAL | Status: DC
  Administered 2020-07-17: 25 ug via VAGINAL

## 2020-07-17 MED ORDER — BUTORPHANOL TARTRATE 1 MG/ML IJ SOLN: 1.0000 mg | INTRAMUSCULAR | Status: DC | PRN

## 2020-07-17 MED ORDER — OXYTOCIN BOLUS FROM INFUSION
333.0000 mL | Freq: Once | INTRAVENOUS | Status: AC
  Administered 2020-07-17: 333 mL via INTRAVENOUS

## 2020-07-17 MED ORDER — LIDOCAINE HCL (PF) 1 % IJ SOLN: 30.0000 mL | INTRAMUSCULAR | Status: DC | PRN

## 2020-07-17 MED ORDER — BENZOCAINE-MENTHOL 20-0.5 % EX AERO: 1.0000 "application " | INHALATION_SPRAY | CUTANEOUS | Status: DC | PRN

## 2020-07-17 MED ORDER — ONDANSETRON HCL 4 MG/2ML IJ SOLN
4.0000 mg | INTRAMUSCULAR | Status: DC | PRN
  Administered 2020-07-17 – 2020-07-18 (×2): 4 mg via INTRAVENOUS
  Filled 2020-07-17: qty 2

## 2020-07-17 MED ORDER — LACTATED RINGERS IV SOLN: 500.0000 mL | Freq: Once | INTRAVENOUS | Status: AC

## 2020-07-17 MED ORDER — ONDANSETRON HCL 4 MG PO TABS
4.0000 mg | ORAL_TABLET | ORAL | Status: DC | PRN
  Filled 2020-07-17: qty 1

## 2020-07-17 MED ORDER — DIPHENHYDRAMINE HCL 50 MG/ML IJ SOLN: 12.5000 mg | INTRAMUSCULAR | Status: DC | PRN

## 2020-07-17 MED ORDER — LACTATED RINGERS IV SOLN
500.0000 mL | INTRAVENOUS | Status: DC | PRN
  Administered 2020-07-17: 500 mL via INTRAVENOUS

## 2020-07-17 MED ORDER — FENTANYL 2.5 MCG/ML W/ROPIVACAINE 0.15% IN NS 100 ML EPIDURAL (ARMC): 12.0000 mL/h | EPIDURAL | Status: DC

## 2020-07-17 MED ORDER — OXYTOCIN-SODIUM CHLORIDE 30-0.9 UT/500ML-% IV SOLN
2.5000 [IU]/h | INTRAVENOUS | Status: DC
  Administered 2020-07-17: 2.5 [IU]/h via INTRAVENOUS
  Filled 2020-07-17: qty 1000

## 2020-07-17 MED ORDER — MISOPROSTOL 200 MCG PO TABS
ORAL_TABLET | ORAL | Status: AC
  Filled 2020-07-17: qty 4

## 2020-07-17 MED ORDER — IBUPROFEN 600 MG PO TABS
600.0000 mg | ORAL_TABLET | Freq: Four times a day (QID) | ORAL | Status: DC
  Administered 2020-07-17 – 2020-07-18 (×2): 600 mg via ORAL
  Filled 2020-07-17 (×3): qty 1

## 2020-07-17 NOTE — Anesthesia Preprocedure Evaluation (Addendum)
Anesthesia Evaluation  Patient identified by MRN, date of birth, ID band Patient awake    Reviewed: Allergy & Precautions, H&P , NPO status , Patient's Chart, lab work & pertinent test results  History of Anesthesia Complications Negative for: history of anesthetic complications  Airway Mallampati: II  TM Distance: >3 FB Neck ROM: full    Dental no notable dental hx.    Pulmonary    Pulmonary exam normal        Cardiovascular negative cardio ROS       Neuro/Psych negative neurological ROS  negative psych ROS   GI/Hepatic negative GI ROS, Neg liver ROS,   Endo/Other  diabetesHypothyroidism   Renal/GU negative Renal ROS  negative genitourinary   Musculoskeletal   Abdominal   Peds  Hematology negative hematology ROS (+)   Anesthesia Other Findings   Reproductive/Obstetrics (+) Pregnancy                             Anesthesia Physical Anesthesia Plan  ASA: II  Anesthesia Plan: Epidural   Post-op Pain Management:    Induction:   PONV Risk Score and Plan:   Airway Management Planned:   Additional Equipment:   Intra-op Plan:   Post-operative Plan:   Informed Consent: I have reviewed the patients History and Physical, chart, labs and discussed the procedure including the risks, benefits and alternatives for the proposed anesthesia with the patient or authorized representative who has indicated his/her understanding and acceptance.       Plan Discussed with: Anesthesiologist and CRNA  Anesthesia Plan Comments:        Anesthesia Quick Evaluation

## 2020-07-17 NOTE — H&P (Signed)
OB History & Physical   History of Present Illness:  Chief Complaint: elective induction of labor  HPI:  Abigail Lowe is a 29 y.o. [phone] female at [redacted]w[redacted]d dated by 7 week ultrasound. Her pregnancy has been complicated by Depression; Generalized anxiety disorder; Supervision of other normal pregnancy; history of gestational diabetes in prior pregnancy, currently pregnant; Hypothyroidism affecting pregnancy, antepartum; Back pain affecting pregnancy in second trimester; Orthostatic hypotension; Near syncope; Shortness of breath; Palpitations on problem list.   She denies contractions.   She denies leakage of fluid.   She denies vaginal bleeding.   She reports fetal movement.    Total weight gain for pregnancy: 10.9 kg   Obstetrical Problem List: Pregnancy#4 Problems (from 10/12/19 to present)    Problem Noted Resolved   Supervision of other normal pregnancy, antepartum 11/30/2019 by Vena Austria, MD No   Overview Addendum 06/29/2020 12:32 PM by Natale Milch, MD     Nursing Staff Provider  Office Location  [facility] Dating   7 wk [place]  Language  English Anatomy US  Normal   Flu Vaccine  07/2019 Genetic Screen  NIPS:diploid XY   AFP: neg    TDaP vaccine   05/18/2020 Hgb A1C or  GTT Early hgbA1c: normal Third trimester : 146, passed 3 hr.  Rhogam   [x ] 28wks 7/15   LAB RESULTS   Feeding Plan Breast Blood Type A/Negative/-- (02/10 1601)   Contraception  desires tubal Antibody Negative (02/10 1601)  Circumcision  Rubella 4.06 (02/10 1601)  Pediatrician   RPR Non Reactive (02/10 1601)   Support Person  HBsAg Negative (02/10 1601)   Prenatal Classes  HIV Non Reactive (02/10 1601)    Varicella immune  BTL Consent Signed 06/29/2020 GBS GC/CT Negative on 9/10 Neg/Neg       VBAC Consent  Pap  NIL 2021    Hgb Electro      CF      SMA               Previous Version   History of gestational diabetes in prior pregnancy, currently pregnant 11/30/2019 by Vena Austria, MD No    Hypothyroidism affecting pregnancy, antepartum 11/30/2019 by Vena Austria, MD No       Maternal Medical History:   Past Medical History:  Diagnosis Date   Anxiety    Depression    Gestational diabetes    Hypothyroidism    Strep pharyngitis 12/15/2017    Past Surgical History:  Procedure Laterality Date   CHOLECYSTECTOMY N/A 11/25/2016   Procedure: LAPAROSCOPIC CHOLECYSTECTOMY WITH INTRAOPERATIVE CHOLANGIOGRAM;  Surgeon: Manus Rudd, MD;  Location: [facility] OR;  Service: General;  Laterality: N/A;   DILATION AND CURETTAGE OF UTERUS  11/2018   SAB   INTRAUTERINE DEVICE (IUD) INSERTION      No Known Allergies  Prior to Admission medications   Medication Sig Start Date End Date Taking? Authorizing Provider  levothyroxine (SYNTHROID) 25 MCG tablet Take 25 mcg by mouth daily. 10/18/19  Yes [provider]  Prenatal MV & Min w/FA-DHA (PRENATAL ADULT GUMMY/DHA/FA PO) Take by mouth.   Yes [provider]    OB History  Gravida Para Term Preterm AB Living  4 2 2   1 2   SAB TAB Ectopic Multiple Live Births  1       2    # Outcome Date GA Lbr Len/2nd Weight Sex Delivery Anes PTL Lv  4 Current  3 SAB 11/30/18          2 Term 08/26/14   3969 g F Vag-Spont  N LIV  1 Term 09/30/11   4026 g M Vag-Spont  N LIV    Prenatal care site: [facility]  Social History: She  reports that she has never smoked. She has never used smokeless tobacco. She reports that she does not drink alcohol and does not use drugs.  Family History: family history includes ADD / ADHD in her brother; Cancer in her maternal grandfather and paternal grandfather; Cervical cancer in her maternal grandmother and mother; Diabetes in her paternal grandfather; Heart disease in her maternal grandfather; Hyperlipidemia in her father and paternal grandfather; Hypertension in her father and maternal grandmother.    Review of Systems:  Review of Systems  Constitutional: Negative for  chills and fever.  HENT: Negative for congestion, ear discharge, ear pain, hearing loss, sinus pain and sore throat.   Eyes: Negative for blurred vision and double vision.  Respiratory: Negative for cough, shortness of breath and wheezing.   Cardiovascular: Negative for chest pain, palpitations and leg swelling.  Gastrointestinal: Negative for abdominal pain, blood in stool, constipation, diarrhea, heartburn, melena, nausea and vomiting.  Genitourinary: Negative for dysuria, flank pain, frequency, hematuria and urgency.  Musculoskeletal: Negative for back pain, joint pain and myalgias.  Skin: Negative for itching and rash.  Neurological: Negative for dizziness, tingling, tremors, sensory change, speech change, focal weakness, seizures, loss of consciousness, weakness and headaches.  Endo/Heme/Allergies: Negative for environmental allergies. Does not bruise/bleed easily.  Psychiatric/Behavioral: Negative for depression, hallucinations, memory loss, substance abuse and suicidal ideas. The patient is not nervous/anxious and does not have insomnia.      Physical Exam:  BP 129/80 (BP Location: Left Arm)    Pulse 99    Temp 98.4 F (36.9 C) (Oral)    Resp 16    Ht 5\' 2"  (1.575 m)    Wt 84.4 kg    LMP 10/12/2019 (Exact Date)    BMI 34.02 kg/m   Constitutional: Well nourished, well developed female in no acute distress.  HEENT: normal Skin: Warm and dry.  Cardiovascular: Regular rate and rhythm.   Extremity: no edema  Respiratory: Clear to auscultation bilateral. Normal respiratory effort Abdomen: FHT present Back: no CVAT Neuro: DTRs 2+, Cranial nerves grossly intact Psych: Alert and Oriented x3. No memory deficits. Normal mood and affect.  MS: normal gait, normal bilateral lower extremity ROM/strength/stability.  Pelvic exam: (female chaperone present)  is not limited by body habitus EGBUS: within normal limits Vagina: within normal limits and with normal mucosa  Cervix: 4/60/-2 on  admission   Baseline FHR: 140 beats/min   Variability: moderate   Accelerations: present   Decelerations: absent Contractions: present frequency: evert 1.5-3 minutes Overall assessment: reassuring   Lab Results  Component Value Date   SARSCOV2NAA NEGATIVE 07/16/2020  ]  Assessment:  Abigail Lowe is a 29 y.o. G25P2012 female at [redacted]w[redacted]d with favorable cervix at term, elective induction of labor.   Plan:  1. Admit to Labor & Delivery  2. CBC, T&S, Clrs, IVF 3. GBS negative.   4. Fetal well-being: Category I 5. Begin induction with misoprostol; 25 mcg cervical, 25 mcg buccal   [redacted]w[redacted]d, CNM 07/17/2020 11:22 AM

## 2020-07-17 NOTE — Discharge Summary (Addendum)
Postpartum Discharge Summary   Patient Name: Abigail Lowe DOB: Feb 12, 1991 MRN: [id number]  Date of admission: 07/17/2020 Delivery date:07/17/2020  Delivering provider: Prentice Docker D  Date of discharge: 07/18/2020  Admitting diagnosis: Encounter for planned induction of labor [Z34.90] Intrauterine pregnancy: [redacted]w[redacted]d    Secondary diagnosis:  Active Problems:   Supervision of other normal pregnancy, antepartum   Hypothyroidism affecting pregnancy, antepartum   Encounter for planned induction of labor   [redacted] weeks gestation of pregnancy   Encounter for sterilization   Encounter for care or examination of lactating mother   Postpartum care following vaginal delivery  Additional problems: none    Discharge diagnosis: Term Pregnancy Delivered                                              Post partum procedures:postpartum tubal ligation Augmentation: AROM and Cytotec Complications: None  Hospital course: Induction of Labor With Vaginal Delivery   29y.o. yo [id number] admitted to the hospital 07/17/2020 for induction of labor.  Indication for induction: Favorable cervix at term.  Patient had an uncomplicated labor course as follows: Membrane Rupture Time/Date: 3:49 PM ,07/17/2020   Delivery Method:Vaginal, Spontaneous  Episiotomy: None  Lacerations:  2nd degree  Details of delivery can be found in separate delivery note.  Patient had a routine postpartum course. Tubal ligation done on postpartum day 1. Patient is discharged home 07/18/20.  Newborn Data: Birth date:07/17/2020  Birth time:4:55 PM  Gender:Female  Living status:Living  Apgars:8 ,8  Weight:4010 g   Magnesium Sulfate received: No BMZ received: No Rhophylac:Yes MMR:No T-DaP:Given prenatally on 05/18/2020 Transfusion:No  Physical exam  Vitals:   07/18/20 1005 07/18/20 1050 07/18/20 1205 07/18/20 1554  BP: 94/61 104/72 106/73 114/77  Pulse: 77 79 83 76  Resp: '14 18 18 18  ' Temp:  98.1 F (36.7 C) 98.3  F (36.8 C) 98.3 F (36.8 C)  TempSrc:  Oral Oral Oral  SpO2: 96% 97% 95%   Weight:      Height:       General: alert, cooperative and no distress Lochia: appropriate Uterine Fundus: firm Incision: Healing well with no significant drainage DVT Evaluation: No evidence of DVT seen on physical exam. Labs: Lab Results  Component Value Date   WBC 9.5 07/18/2020   HGB 9.3 (L) 07/18/2020   HCT 28.5 (L) 07/18/2020   MCV 79.8 (L) 07/18/2020   PLT 156 07/18/2020   CMP Latest Ref Rng & Units 03/23/2020  Glucose 70 - 99 mg/dL 137(H)  BUN 6 - 20 mg/dL 5(L)  Creatinine 0.44 - 1.00 mg/dL 0.65  Sodium 135 - 145 mmol/L 134(L)  Potassium 3.5 - 5.1 mmol/L 3.5  Chloride 98 - 111 mmol/L 101  CO2 22 - 32 mmol/L 22  Calcium 8.9 - 10.3 mg/dL 8.7(L)  Total Protein 6.5 - 8.1 g/dL 7.4  Total Bilirubin 0.3 - 1.2 mg/dL 0.5  Alkaline Phos 38 - 126 U/L 88  AST 15 - 41 U/L 29  ALT 0 - 44 U/L 34   Edinburgh Score: Edinburgh Postnatal Depression Scale Screening Tool 07/18/2020  I have been able to laugh and see the funny side of things. 0  I have looked forward with enjoyment to things. 0  I have blamed myself unnecessarily when things went wrong. 1  I have been anxious or worried  for no good reason. 2  I have felt scared or panicky for no good reason. 2  Things have been getting on top of me. 1  I have been so unhappy that I have had difficulty sleeping. 0  I have felt sad or miserable. 0  I have been so unhappy that I have been crying. 0  The thought of harming myself has occurred to me. 0  Edinburgh Postnatal Depression Scale Total 6      After visit meds:  Allergies as of 07/18/2020   No Known Allergies     Medication List    TAKE these medications   levothyroxine 25 MCG tablet Commonly known as: SYNTHROID Take 25 mcg by mouth daily.   oxyCODONE 5 MG immediate release tablet Commonly known as: Oxy IR/ROXICODONE Take 1 tablet (5 mg total) by mouth every 6 (six) hours as needed for up  to 3 days for severe pain.   PRENATAL ADULT GUMMY/DHA/FA PO Take by mouth.        Discharge home in stable condition Infant Feeding: Breast Infant Disposition:home with mother Discharge instruction: per After Visit Summary and Postpartum booklet. Activity: Advance as tolerated. Pelvic rest for 6 weeks.  Diet: routine diet Anticipated Birth Control: BTL done PP Postpartum Appointment:2 weeks Additional Postpartum F/U: Postpartum Depression checkup Future Appointments:No future appointments.   Follow up Visit:  Follow-up Information    Will Bonnet, MD. Schedule an appointment as soon as possible for a visit in 2 week(s).   Specialty: Obstetrics and Gynecology Why: Please call to schedule a 2 week postpartum follow up appointment for a mood check and an incision check with Dr. Clarnce Flock information: [place] [address] [phone]              SIGNED: Christean Leaf, [facility] 07/18/2020, 3:28 PM

## 2020-07-17 NOTE — Anesthesia Procedure Notes (Signed)
Epidural Patient location during procedure: OB Start time: 07/17/2020 1:41 PM End time: 07/17/2020 2:01 PM  Staffing Resident/CRNA: Junious Silk, CRNA Performed: resident/CRNA   Preanesthetic Checklist Completed: patient identified, IV checked, site marked, risks and benefits discussed, surgical consent, monitors and equipment checked, pre-op evaluation and timeout performed  Epidural Patient position: sitting Prep: Betadine Patient monitoring: heart rate, continuous pulse ox and blood pressure Approach: midline Location: L3-L4 Injection technique: LOR air  Needle:  Needle type: Tuohy  Needle gauge: 17 G Needle length: 9 cm and 9 Catheter type: closed end flexible Catheter size: 20 Guage Test dose: negative and 1.5% lidocaine with Epi 1:200 K  Assessment Sensory level: T10 Events: blood not aspirated, injection not painful, no injection resistance, no paresthesia and negative IV test  Additional Notes   Patient tolerated the insertion well without complications.Reason for block:procedure for pain

## 2020-07-18 ENCOUNTER — Ambulatory Visit: Payer: 59 | Admitting: Internal Medicine

## 2020-07-18 ENCOUNTER — Inpatient Hospital Stay: Payer: Medicaid Other | Admitting: Anesthesiology

## 2020-07-18 ENCOUNTER — Encounter: Payer: Self-pay | Admitting: Advanced Practice Midwife

## 2020-07-18 ENCOUNTER — Encounter
Admission: EM | Disposition: A | Discharge status: Home / Self Care | Payer: Self-pay | Source: Home / Self Care | Attending: Obstetrics and Gynecology

## 2020-07-18 DIAGNOSIS — Z302 Encounter for sterilization: Secondary | ICD-10-CM

## 2020-07-18 HISTORY — PX: TUBAL LIGATION: SHX77

## 2020-07-18 LAB — CBC
HCT: 28.5 % — ABNORMAL LOW (ref 36.0–46.0)
Hemoglobin: 9.3 g/dL — ABNORMAL LOW (ref 12.0–15.0)
MCH: 26.1 pg (ref 26.0–34.0)
MCHC: 32.6 g/dL (ref 30.0–36.0)
MCV: 79.8 fL — ABNORMAL LOW (ref 80.0–100.0)
Platelets: 156 10*3/uL (ref 150–400)
RBC: 3.57 MIL/uL — ABNORMAL LOW (ref 3.87–5.11)
RDW: 15.3 % (ref 11.5–15.5)
WBC: 9.5 10*3/uL (ref 4.0–10.5)
nRBC: 0 % (ref 0.0–0.2)

## 2020-07-18 LAB — FETAL SCREEN: Fetal Screen: NEGATIVE

## 2020-07-18 LAB — RPR: RPR Ser Ql: NONREACTIVE

## 2020-07-18 SURGERY — LIGATION, FALLOPIAN TUBE, POSTPARTUM
Anesthesia: General | Laterality: Bilateral

## 2020-07-18 MED ORDER — LIDOCAINE HCL (PF) 2 % IJ SOLN
INTRAMUSCULAR | Status: AC
  Filled 2020-07-18: qty 15

## 2020-07-18 MED ORDER — SENNOSIDES-DOCUSATE SODIUM 8.6-50 MG PO TABS: 2.0000 | ORAL_TABLET | ORAL | Status: DC

## 2020-07-18 MED ORDER — PROPOFOL 10 MG/ML IV BOLUS
INTRAVENOUS | Status: AC
  Filled 2020-07-18: qty 20

## 2020-07-18 MED ORDER — POVIDONE-IODINE 10 % EX SWAB: 2.0000 "application " | Freq: Once | CUTANEOUS | Status: DC

## 2020-07-18 MED ORDER — COCONUT OIL OIL
1.0000 "application " | TOPICAL_OIL | Status: DC | PRN
  Administered 2020-07-18: 1 via TOPICAL
  Filled 2020-07-18: qty 120

## 2020-07-18 MED ORDER — OXYCODONE HCL 5 MG/5ML PO SOLN: 5.0000 mg | Freq: Once | ORAL | Status: DC | PRN

## 2020-07-18 MED ORDER — WITCH HAZEL-GLYCERIN EX PADS: 1.0000 "application " | MEDICATED_PAD | CUTANEOUS | Status: DC | PRN

## 2020-07-18 MED ORDER — MIDAZOLAM HCL 2 MG/2ML IJ SOLN
INTRAMUSCULAR | Status: AC
  Filled 2020-07-18: qty 2

## 2020-07-18 MED ORDER — EPINEPHRINE PF 1 MG/ML IJ SOLN
INTRAMUSCULAR | Status: AC
  Filled 2020-07-18: qty 1

## 2020-07-18 MED ORDER — PROPOFOL 500 MG/50ML IV EMUL
INTRAVENOUS | Status: DC | PRN
  Administered 2020-07-18: 125 ug/kg/min via INTRAVENOUS

## 2020-07-18 MED ORDER — PHENYLEPHRINE HCL (PRESSORS) 10 MG/ML IV SOLN
INTRAVENOUS | Status: DC | PRN
  Administered 2020-07-18: 50 ug via INTRAVENOUS
  Administered 2020-07-18: 100 ug via INTRAVENOUS

## 2020-07-18 MED ORDER — DEXAMETHASONE SODIUM PHOSPHATE 10 MG/ML IJ SOLN
INTRAMUSCULAR | Status: AC
  Filled 2020-07-18: qty 1

## 2020-07-18 MED ORDER — FENTANYL CITRATE (PF) 100 MCG/2ML IJ SOLN
INTRAMUSCULAR | Status: DC | PRN
Start: 2020-07-18 — End: 2020-07-18
  Administered 2020-07-18: 50 ug via INTRAVENOUS

## 2020-07-18 MED ORDER — FENTANYL CITRATE (PF) 100 MCG/2ML IJ SOLN: 25.0000 ug | INTRAMUSCULAR | Status: DC | PRN

## 2020-07-18 MED ORDER — INFLUENZA VAC SPLIT QUAD 0.5 ML IM SUSY
0.5000 mL | PREFILLED_SYRINGE | INTRAMUSCULAR | Status: AC
  Administered 2020-07-18: 0.5 mL via INTRAMUSCULAR

## 2020-07-18 MED ORDER — KETOROLAC TROMETHAMINE 30 MG/ML IJ SOLN
INTRAMUSCULAR | Status: DC | PRN
  Administered 2020-07-18: 30 mg via INTRAVENOUS

## 2020-07-18 MED ORDER — SODIUM CHLORIDE 0.9 % IV SOLN
INTRAVENOUS | Status: DC | PRN
  Administered 2020-07-18: 50 ug/min via INTRAVENOUS

## 2020-07-18 MED ORDER — LACTATED RINGERS IV SOLN: INTRAVENOUS | Status: DC

## 2020-07-18 MED ORDER — SIMETHICONE 80 MG PO CHEW: 80.0000 mg | CHEWABLE_TABLET | ORAL | Status: DC | PRN

## 2020-07-18 MED ORDER — RHO D IMMUNE GLOBULIN 1500 UNIT/2ML IJ SOSY
300.0000 ug | PREFILLED_SYRINGE | Freq: Once | INTRAMUSCULAR | Status: AC
  Administered 2020-07-18: 300 ug via INTRAVENOUS
  Filled 2020-07-18: qty 2

## 2020-07-18 MED ORDER — ONDANSETRON HCL 4 MG/2ML IJ SOLN
INTRAMUSCULAR | Status: DC | PRN
  Administered 2020-07-18: 4 mg via INTRAVENOUS

## 2020-07-18 MED ORDER — ACETAMINOPHEN 10 MG/ML IV SOLN
INTRAVENOUS | Status: DC | PRN
  Administered 2020-07-18: 1000 mg via INTRAVENOUS

## 2020-07-18 MED ORDER — ONDANSETRON HCL 4 MG/2ML IJ SOLN
INTRAMUSCULAR | Status: AC
  Filled 2020-07-18: qty 2

## 2020-07-18 MED ORDER — SUCCINYLCHOLINE CHLORIDE 20 MG/ML IJ SOLN: INTRAMUSCULAR | Status: DC | PRN

## 2020-07-18 MED ORDER — DIPHENHYDRAMINE HCL 25 MG PO CAPS: 25.0000 mg | ORAL_CAPSULE | Freq: Four times a day (QID) | ORAL | Status: DC | PRN

## 2020-07-18 MED ORDER — HYDROCODONE-ACETAMINOPHEN 5-325 MG PO TABS: 2.0000 | ORAL_TABLET | ORAL | Status: DC | PRN

## 2020-07-18 MED ORDER — FERROUS SULFATE 325 (65 FE) MG PO TABS
325.0000 mg | ORAL_TABLET | Freq: Two times a day (BID) | ORAL | Status: DC
  Administered 2020-07-18: 325 mg via ORAL
  Filled 2020-07-18: qty 1

## 2020-07-18 MED ORDER — DEXMEDETOMIDINE (PRECEDEX) IN NS 20 MCG/5ML (4 MCG/ML) IV SYRINGE
PREFILLED_SYRINGE | INTRAVENOUS | Status: AC
  Filled 2020-07-18: qty 5

## 2020-07-18 MED ORDER — MENTHOL 3 MG MT LOZG
1.0000 | LOZENGE | OROMUCOSAL | Status: DC | PRN
  Filled 2020-07-18: qty 9

## 2020-07-18 MED ORDER — ROCURONIUM BROMIDE 100 MG/10ML IV SOLN
INTRAVENOUS | Status: DC | PRN
  Administered 2020-07-18: 50 mg via INTRAVENOUS

## 2020-07-18 MED ORDER — OXYCODONE HCL 5 MG PO TABS
5.0000 mg | ORAL_TABLET | ORAL | Status: DC | PRN
  Administered 2020-07-18: 5 mg via ORAL
  Filled 2020-07-18: qty 1

## 2020-07-18 MED ORDER — PROPOFOL 10 MG/ML IV BOLUS
INTRAVENOUS | Status: DC | PRN
  Administered 2020-07-18: 200 mg via INTRAVENOUS

## 2020-07-18 MED ORDER — PROPOFOL 500 MG/50ML IV EMUL
INTRAVENOUS | Status: AC
  Filled 2020-07-18: qty 50

## 2020-07-18 MED ORDER — EPHEDRINE 5 MG/ML INJ
INTRAVENOUS | Status: AC
  Filled 2020-07-18: qty 10

## 2020-07-18 MED ORDER — SUGAMMADEX SODIUM 200 MG/2ML IV SOLN
INTRAVENOUS | Status: DC | PRN
  Administered 2020-07-18: 200 mg via INTRAVENOUS

## 2020-07-18 MED ORDER — BUPIVACAINE HCL (PF) 0.5 % IJ SOLN
INTRAMUSCULAR | Status: AC
  Filled 2020-07-18: qty 10

## 2020-07-18 MED ORDER — OXYCODONE HCL 5 MG PO TABS
5.0000 mg | ORAL_TABLET | Freq: Four times a day (QID) | ORAL | 0 refills | Status: AC | PRN
Start: 2020-07-18 — End: 2020-07-21

## 2020-07-18 MED ORDER — LIDOCAINE HCL (PF) 2 % IJ SOLN
INTRAMUSCULAR | Status: DC | PRN
  Administered 2020-07-18: 100 mg via EPIDURAL
  Administered 2020-07-18: 200 mg via EPIDURAL

## 2020-07-18 MED ORDER — ACETAMINOPHEN 10 MG/ML IV SOLN: 1000.0000 mg | Freq: Once | INTRAVENOUS | Status: DC | PRN

## 2020-07-18 MED ORDER — KETOROLAC TROMETHAMINE 30 MG/ML IJ SOLN
INTRAMUSCULAR | Status: AC
  Filled 2020-07-18: qty 1

## 2020-07-18 MED ORDER — ACETAMINOPHEN 10 MG/ML IV SOLN
INTRAVENOUS | Status: AC
  Filled 2020-07-18: qty 100

## 2020-07-18 MED ORDER — BUPIVACAINE LIPOSOME 1.3 % IJ SUSP
INTRAMUSCULAR | Status: AC
  Filled 2020-07-18: qty 20

## 2020-07-18 MED ORDER — DEXAMETHASONE SODIUM PHOSPHATE 10 MG/ML IJ SOLN
INTRAMUSCULAR | Status: DC | PRN
  Administered 2020-07-18: 8 mg via INTRAVENOUS

## 2020-07-18 MED ORDER — MIDAZOLAM HCL 2 MG/2ML IJ SOLN
INTRAMUSCULAR | Status: DC | PRN
  Administered 2020-07-18: 2 mg via INTRAVENOUS

## 2020-07-18 MED ORDER — FENTANYL CITRATE (PF) 100 MCG/2ML IJ SOLN
INTRAMUSCULAR | Status: AC
  Filled 2020-07-18: qty 2

## 2020-07-18 MED ORDER — OXYCODONE HCL 5 MG PO TABS: 5.0000 mg | ORAL_TABLET | Freq: Once | ORAL | Status: DC | PRN

## 2020-07-18 MED ORDER — ONDANSETRON HCL 4 MG/2ML IJ SOLN: 4.0000 mg | Freq: Once | INTRAMUSCULAR | Status: DC | PRN

## 2020-07-18 MED ORDER — HYDROCODONE-ACETAMINOPHEN 5-325 MG PO TABS
1.0000 | ORAL_TABLET | ORAL | Status: DC | PRN
  Administered 2020-07-18: 1 via ORAL
  Filled 2020-07-18: qty 1

## 2020-07-18 MED ORDER — LEVOTHYROXINE SODIUM 25 MCG PO TABS
25.0000 ug | ORAL_TABLET | Freq: Every day | ORAL | Status: DC
  Administered 2020-07-18: 25 ug via ORAL
  Filled 2020-07-18 (×2): qty 1

## 2020-07-18 MED ORDER — PRENATAL MULTIVITAMIN CH
1.0000 | ORAL_TABLET | Freq: Every day | ORAL | Status: DC
  Administered 2020-07-18: 1 via ORAL
  Filled 2020-07-18: qty 1

## 2020-07-18 MED ORDER — DEXMEDETOMIDINE HCL 200 MCG/2ML IV SOLN
INTRAVENOUS | Status: DC | PRN
  Administered 2020-07-18 (×2): 8 ug via INTRAVENOUS

## 2020-07-18 MED ORDER — BUPIVACAINE LIPOSOME 1.3 % IJ SUSP
INTRAMUSCULAR | Status: DC | PRN
  Administered 2020-07-18: 10 mL

## 2020-07-18 MED ORDER — DIBUCAINE (PERIANAL) 1 % EX OINT: 1.0000 "application " | TOPICAL_OINTMENT | CUTANEOUS | Status: DC | PRN

## 2020-07-18 MED ORDER — HYDROCODONE-ACETAMINOPHEN 5-325 MG PO TABS
1.0000 | ORAL_TABLET | Freq: Three times a day (TID) | ORAL | Status: DC | PRN
  Administered 2020-07-18: 1 via ORAL
  Filled 2020-07-18: qty 1

## 2020-07-18 MED ORDER — BUPIVACAINE HCL (PF) 0.5 % IJ SOLN
INTRAMUSCULAR | Status: AC
  Filled 2020-07-18: qty 30

## 2020-07-18 SURGICAL SUPPLY — 33 items
BLADE SURG SZ11 CARB STEEL (BLADE) ×3 IMPLANT
CHLORAPREP W/TINT 26 (MISCELLANEOUS) ×3 IMPLANT
COVER WAND RF STERILE (DRAPES) ×3 IMPLANT
DERMABOND ADVANCED (GAUZE/BANDAGES/DRESSINGS) ×2
DERMABOND ADVANCED .7 DNX12 (GAUZE/BANDAGES/DRESSINGS) ×1 IMPLANT
DRAPE LAPAROTOMY 77X122 PED (DRAPES) ×3 IMPLANT
ELECT CAUTERY BLADE 6.4 (BLADE) ×3 IMPLANT
ELECT REM PT RETURN 9FT ADLT (ELECTROSURGICAL) ×3
ELECTRODE REM PT RTRN 9FT ADLT (ELECTROSURGICAL) ×1 IMPLANT
GLOVE BIO SURGEON STRL SZ7 (GLOVE) ×3 IMPLANT
GLOVE BIOGEL PI IND STRL 7.5 (GLOVE) ×1 IMPLANT
GLOVE BIOGEL PI INDICATOR 7.5 (GLOVE) ×2
GOWN STRL REUS W/ TWL LRG LVL3 (GOWN DISPOSABLE) ×2 IMPLANT
GOWN STRL REUS W/ TWL XL LVL3 (GOWN DISPOSABLE) ×1 IMPLANT
GOWN STRL REUS W/TWL LRG LVL3 (GOWN DISPOSABLE) ×6
GOWN STRL REUS W/TWL XL LVL3 (GOWN DISPOSABLE) ×3
KIT TURNOVER CYSTO (KITS) ×3 IMPLANT
LABEL OR SOLS (LABEL) ×3 IMPLANT
NEEDLE HYPO 22GX1.5 SAFETY (NEEDLE) ×3 IMPLANT
NS IRRIG 500ML POUR BTL (IV SOLUTION) ×3 IMPLANT
PACK BASIN MINOR (MISCELLANEOUS) ×3 IMPLANT
RETRACTOR RING XSMALL (MISCELLANEOUS) IMPLANT
RETRACTOR WOUND ALXS 18CM SML (MISCELLANEOUS) IMPLANT
RTRCTR WOUND ALEXIS 13CM XS SH (MISCELLANEOUS)
RTRCTR WOUND ALEXIS O 18CM SML (MISCELLANEOUS)
SUT MNCRL 4-0 (SUTURE) ×3
SUT MNCRL 4-0 27XMFL (SUTURE) ×1
SUT PLAIN GUT 0 (SUTURE) ×6 IMPLANT
SUT VIC AB 2-0 UR6 27 (SUTURE) ×3 IMPLANT
SUT VIC AB 3-0 SH 27 (SUTURE) ×3
SUT VIC AB 3-0 SH 27X BRD (SUTURE) ×1 IMPLANT
SUTURE MNCRL 4-0 27XMF (SUTURE) ×1 IMPLANT
SYR 10ML LL (SYRINGE) ×3 IMPLANT

## 2020-07-18 NOTE — Progress Notes (Signed)
Pre-procedure checklist complete. Patient transported to OR for postpartum bilateral tubal ligation at this time.     Inge Rise, RN

## 2020-07-18 NOTE — Discharge Instructions (Signed)
Please call your doctor or return to the ER if you experience any chest pains, shortness of breath, dizziness, visual changes, severe headache (unrelieved by pain meds), fever greater than 101, any heavy bleeding (saturating more than 1 pad per hour), large clots, or foul smelling discharge, any worsening abdominal pain and cramping that is not controlled by pain medication, any calf/leg pain or redness, any breast concerns (redness/pain), or any signs of postpartum depression. No tampons, enemas, douches, or sexual intercourse for 6 weeks. Also avoid tub baths, hot tubs, or swimming for 6 weeks.    Check your incision daily for any signs of infection such as redness, warmth, swelling, increased pain, or pus/foul smelling drainage

## 2020-07-18 NOTE — Anesthesia Procedure Notes (Addendum)
Procedure Name: Intubation Date/Time: 07/18/2020 9:32 AM Performed by: Lynden Oxford, CRNA Pre-anesthesia Checklist: Patient identified, Emergency Drugs available, Suction available and Patient being monitored Patient Re-evaluated:Patient Re-evaluated prior to induction Oxygen Delivery Method: Circle system utilized Preoxygenation: Pre-oxygenation with 100% oxygen Induction Type: IV induction and Rapid sequence Laryngoscope Size: McGraph and 3 Grade View: Grade I Tube type: Oral Tube size: 6.5 mm Number of attempts: 1 Airway Equipment and Method: Stylet,  Oral airway and Video-laryngoscopy Placement Confirmation: ETT inserted through vocal cords under direct vision,  positive ETCO2 and breath sounds checked- equal and bilateral Secured at: 21 cm Tube secured with: Tape Dental Injury: Teeth and Oropharynx as per pre-operative assessment

## 2020-07-18 NOTE — Anesthesia Postprocedure Evaluation (Signed)
Anesthesia Post Note  Patient: Abigail Lowe  Procedure(s) Performed: POST PARTUM TUBAL LIGATION (Bilateral )  Patient location during evaluation: PACU Anesthesia Type: General Level of consciousness: awake and alert Pain management: pain level controlled Vital Signs Assessment: post-procedure vital signs reviewed and stable Respiratory status: spontaneous breathing, nonlabored ventilation, respiratory function stable and patient connected to nasal cannula oxygen Cardiovascular status: blood pressure returned to baseline and stable Postop Assessment: no apparent nausea or vomiting Anesthetic complications: no   No complications documented.   Last Vitals:  Vitals:   07/18/20 1003 07/18/20 1005  BP:  94/61  Pulse: 74 77  Resp: 15 14  Temp: (!) 36.1 C   SpO2: 96% 96%    Last Pain:  Vitals:   07/18/20 1003  TempSrc:   PainSc: 0-No pain                 Corinda Gubler

## 2020-07-18 NOTE — Progress Notes (Signed)
Patient discharged with infant. Discharge instructions, prescriptions, and follow up appointments given to and reviewed with patient. Patient verbalized understanding. Escorted out by [employer], Charity fundraiser.

## 2020-07-18 NOTE — Anesthesia Postprocedure Evaluation (Signed)
Anesthesia Post Note  Patient: Abigail Lowe  Procedure(s) Performed: AN AD HOC LABOR EPIDURAL  Patient location during evaluation: Mother Baby Anesthesia Type: Epidural Level of consciousness: awake and alert Pain management: pain level controlled Vital Signs Assessment: post-procedure vital signs reviewed and stable Respiratory status: spontaneous breathing, nonlabored ventilation and respiratory function stable Cardiovascular status: stable Postop Assessment: no headache, no backache and epidural receding Anesthetic complications: no   No complications documented.   Last Vitals:  Vitals:   07/18/20 0020 07/18/20 0347  BP: 112/65 111/78  Pulse: 68 65  Resp: 18 20  Temp: 36.4 C 36.9 C  SpO2: 99% 98%    Last Pain:  Vitals:   07/18/20 0347  TempSrc: Oral  PainSc:                  Elmarie Mainland

## 2020-07-18 NOTE — Op Note (Signed)
Operative Note Postpartum Tubal Ligation  Pre-Op Diagnosis: multiparity, desires permanent sterilization  Post-Op Diagnosis: multiparity, desires permanent sterilization  Procedures:  Postpartum tubal ligation via Pomeroy method  Primary Surgeon: Thomasene Mohair, MD  Anesthesia: General ET, Exparel for local.    EBL: 10 ml   IVF: 1,000 mL   Specimens: portion of right and left fallopian tubes  Drains: None  Complications: None   Disposition: PACU   Condition: Stable   Findings: normal appearing bilateral fallopian tubes  Indication: The patient is a 29 y.o. [id number] who is postpartum day 1 status post spontaneous vaginal delivery.  She has been counseled extensively regarding risks, benefits, and alternatives to tubal ligation, including non-permanent forms of contraception that are equivalent in efficacy with potentially better side effects.  She has been advised that there is a failure rate of 3-5 in every 1,000 tubal ligations per year with an increased risk of ectopic pregnancy should pregnancy occur.   Procedure Summary:  The patient was taken to the operating room where general anesthesia was administered and found to be adequate. After timeout was called a small transverse, infraumbilical incision was made with the scalpel. The incision was carried down through the fascia until the peritoneum was identified and entered. The peritoneum was noted to be free of any adhesions and the incision was then extended.  The patient's left fallopian tube was identified, brought incision, and grasped with a Babcock clamp. The tube was then followed out to the fimbria. The Babcock clamp was then used to grasp the tube approximately 4 cm from the cornual region. A 3 cm segment of tube was then ligated with the 2 free ties of plain gut, and excised. Good hemostasis was noted and the tube was returned to the abdomen. The right fallopian tube was then ligated, and a 3 cm segment excised in a  similar fashion. Excellent hemostasis was noted, and the tube returned to the abdomen.  The peritoneum and fascia were closed in a single layer using 2-0 Vicryl. The skin was closed in a subcuticular fashion using 4-0 monocryl. The closure was also closed with Dermabond.  The patient tolerated the procedure well. Sponge, lap, and needle counts were correct 2. The patient was taken to the recovery room in stable condition.   Sponge, lap, needle, and instrument counts were correct x 2.  VTE prophylaxis: SCDs. Antibiotic prophylaxis: none indicated. The patient tolerated the procedure well and was taken to the PACU in stable condition.   Thomasene Mohair, MD 07/18/2020 9:21 AM

## 2020-07-18 NOTE — Progress Notes (Signed)
Obstetric Postpartum Daily Progress Note  Chief Complaint: Postpartum care after vaginal delivery  Subjective:  29 y.o. [id number] postpartum day #1 status post vaginal delivery.  She is ambulating, is tolerating po, is voiding spontaneously.  Her pain is well controlled on PO pain medications. Her lochia is less than menses. She has been NPO since midnight for surgery today. She does desire a tubal ligation.     Medications SCHEDULED MEDICATIONS   [MAR Hold] ferrous sulfate  325 mg Oral BID WC   [MAR Hold] ibuprofen  600 mg Oral Q6H   [MAR Hold] levothyroxine  25 mcg Oral Daily   povidone-iodine  2 application Topical Once   [MAR Hold] prenatal multivitamin  1 tablet Oral Q1200   [MAR Hold] senna-docusate  2 tablet Oral Q24H    MEDICATION INFUSIONS   lactated ringers 125 mL/hr at 07/18/20 0345    PRN MEDICATIONS  [MAR Hold] acetaminophen, [MAR Hold] benzocaine-Menthol, [MAR Hold] coconut oil, [MAR Hold] witch hazel-glycerin **AND** [MAR Hold] dibucaine, [MAR Hold] diphenhydrAMINE, [MAR Hold] HYDROcodone-acetaminophen, [MAR Hold] ondansetron **OR** [MAR Hold] ondansetron (ZOFRAN) IV, [MAR Hold] simethicone    Objective:   Vitals:   07/18/20 0020 07/18/20 0347 07/18/20 0744 07/18/20 0752  BP: 112/65 111/78 115/81 115/73  Pulse: 68 65 71 69  Resp: 18 20 18 16   Temp: 97.6 F (36.4 C) 98.4 F (36.9 C) 98.6 F (37 C) 97.6 F (36.4 C)  TempSrc: Oral Oral Oral   SpO2: 99% 98% 99% 99%  Weight:    84.4 kg  Height:    5\' 2"  (1.575 m)    Current Vital Signs 24h Vital Sign Ranges  T 97.6 F (36.4 C) Temp  Avg: 98.3 F (36.8 C)  Min: 97.6 F (36.4 C)  Max: 99.4 F (37.4 C)  BP 115/73 BP  Min: 111/78  Max: 130/66  HR 69 Pulse  Avg: 88.8  Min: 65  Max: 102  RR 16 Resp  Avg: 17.4  Min: 16  Max: 20  SaO2 99 % Room Air SpO2  Avg: 98.5 %  Min: 97 %  Max: 100 %       24 Hour I/O Current Shift I/O  Time Ins Outs 09/28 0701 - 09/29 0700 In: 424 [I.V.:424] Out: 1465 [Urine:800]  09/29 0701 - 09/29 1900 In: -  Out: 900 [Urine:900]  General: NAD Pulmonary: no increased work of breathing Abdomen: non-distended, non-tender, fundus firm at level of umbilicus  Well-healed incision just under the umbilicus.   Extremities: no edema, no erythema, no tenderness  Labs:  Recent Labs  Lab 07/17/20 0850 07/18/20 0606  WBC 8.0 9.5  HGB 10.7* 9.3*  HCT 33.5* 28.5*  PLT 195 156     Assessment:   29 y.o. 07/19/20 postpartum day # 1 status post spontaneous vaginal delivery, doing well.  Desires permanent sterilization.  Plan:   1) Acute blood loss anemia - hemodynamically stable and asymptomatic - po ferrous sulfate  2) A NEG Performed at [facility], [address], [place], [address] [place]  /  Information for the patient's newborn:  [place], [place] [id number]  O POS   / Rubella 4.06 (02/10 1601)/ Varicella Immune  4) 29 y.o. 11-25-2001  with undesired fertility, desires permanent sterilization.  Other reversible forms of contraception were discussed with patient; she declines all other modalities. Permanent nature of as well as associated risks of the procedure discussed with patient including but not limited to: risk of regret, permanence of method, bleeding, infection,  injury to surrounding organs and need for additional procedures.  Failure risk of 0.5-1% with increased risk of ectopic gestation if pregnancy occurs was also discussed with patient.  She would like to proceed.    5) Disposition: home today or tomorrow.   Thomasene Mohair, MD 07/18/2020 7:57 AM

## 2020-07-18 NOTE — Anesthesia Preprocedure Evaluation (Signed)
Anesthesia Evaluation  Patient identified by MRN, date of birth, ID band Patient awake    Reviewed: Allergy & Precautions, H&P , NPO status , Patient's Chart, lab work & pertinent test results  History of Anesthesia Complications Negative for: history of anesthetic complications  Airway Mallampati: II  TM Distance: >3 FB Neck ROM: full    Dental no notable dental hx. (+) Dental Advisory Given, Teeth Intact   Pulmonary    Pulmonary exam normal breath sounds clear to auscultation       Cardiovascular negative cardio ROS   Rhythm:Regular Rate:Normal     Neuro/Psych PSYCHIATRIC DISORDERS Anxiety Depression negative neurological ROS     GI/Hepatic Neg liver ROS, GERD  Controlled,GERD improved after delivery   Endo/Other  diabetesHypothyroidism   Renal/GU negative Renal ROS  negative genitourinary   Musculoskeletal   Abdominal   Peds  Hematology negative hematology ROS (+)   Anesthesia Other Findings   Reproductive/Obstetrics Vaginal delivery yesterday                             Anesthesia Physical  Anesthesia Plan  ASA: II  Anesthesia Plan: Epidural   Post-op Pain Management:    Induction:   PONV Risk Score and Plan: 3 and Treatment may vary due to age or medical condition, Ondansetron, Dexamethasone and Midazolam  Airway Management Planned: Natural Airway  Additional Equipment:   Intra-op Plan:   Post-operative Plan:   Informed Consent: I have reviewed the patients History and Physical, chart, labs and discussed the procedure including the risks, benefits and alternatives for the proposed anesthesia with the patient or authorized representative who has indicated his/her understanding and acceptance.       Plan Discussed with: Anesthesiologist and CRNA  Anesthesia Plan Comments: (Plan to use in-situ epidural. Discussed possibility of need for conversion to GETA and  associated risks including sore throat, lip/dental damage, nausea, and rare risks like cardiovascular and neurologic and pulmonary compromise.  Patient voiced understanding.)        Anesthesia Quick Evaluation

## 2020-07-18 NOTE — Transfer of Care (Signed)
Immediate Anesthesia Transfer of Care Note  Patient: Abigail Lowe  Procedure(s) Performed: POST PARTUM TUBAL LIGATION (Bilateral )  Patient Location: PACU  Anesthesia Type:General  Level of Consciousness: drowsy  Airway & Oxygen Therapy: Patient Spontanous Breathing and Patient connected to face mask oxygen  Post-op Assessment: Report given to RN and Post -op Vital signs reviewed and stable  Post vital signs: Reviewed and stable  Last Vitals:  Vitals Value Taken Time  BP 96/59 07/18/20 0935  Temp    Pulse 81 07/18/20 0938  Resp 14 07/18/20 0938  SpO2 100 % 07/18/20 0938  Vitals shown include unvalidated device data.  Last Pain:  Vitals:   07/18/20 0933  TempSrc:   PainSc: (P) 0-No pain      Patients Stated Pain Goal: 0 (07/17/20 1325)  Complications: No complications documented.

## 2020-07-19 LAB — SURGICAL PATHOLOGY

## 2020-07-19 LAB — RHOGAM INJECTION: Unit division: 0

## 2020-08-03 ENCOUNTER — Ambulatory Visit (INDEPENDENT_AMBULATORY_CARE_PROVIDER_SITE_OTHER): Payer: Medicaid Other | Admitting: Obstetrics and Gynecology

## 2020-08-03 ENCOUNTER — Other Ambulatory Visit: Payer: Self-pay

## 2020-08-03 ENCOUNTER — Encounter: Payer: Self-pay | Admitting: Obstetrics and Gynecology

## 2020-08-03 NOTE — Progress Notes (Signed)
Obstetrics & Gynecology Office Visit    Chief Complaint  Patient presents with  . Postpartum Care  Postpartum mood check  History of Present Illness: 29 y.o. [phone] female who is about 2 weeks postpartum from an uncomplicated SVD.  She presents for a mood check due to a history of anxiety and depression. She states that she is doing well.  She has no complaints today. Her lochia is normal.  She has had no postoperative problems.    Past Medical History:  Diagnosis Date  . Anxiety   . Depression   . Gestational diabetes   . Hypothyroidism   . Strep pharyngitis 12/15/2017    Past Surgical History:  Procedure Laterality Date  . CHOLECYSTECTOMY N/A 11/25/2016   Procedure: LAPAROSCOPIC CHOLECYSTECTOMY WITH INTRAOPERATIVE CHOLANGIOGRAM;  Surgeon: Manus Rudd, MD;  Location: [facility] OR;  Service: General;  Laterality: N/A;  . DILATION AND CURETTAGE OF UTERUS  11/2018   SAB  . INTRAUTERINE DEVICE (IUD) INSERTION    . TUBAL LIGATION Bilateral 07/18/2020   Procedure: POST PARTUM TUBAL LIGATION;  Surgeon: Conard Novak, MD;  Location: [facility] ORS;  Service: Gynecology;  Laterality: Bilateral;    Gynecologic History: No LMP recorded.  Obstetric History: [id number]  Family History  Problem Relation Age of Onset  . Hypertension Father   . Hyperlipidemia Father   . Heart disease Maternal Grandfather   . Cancer Maternal Grandfather   . Cervical cancer Mother   . ADD / ADHD Brother   . Hypertension Maternal Grandmother   . Cervical cancer Maternal Grandmother   . Cancer Paternal Grandfather   . Diabetes Paternal Grandfather   . Hyperlipidemia Paternal Grandfather     Social History   Socioeconomic History  . Marital status: Married    Spouse name: [name]  . Number of children: 2  . Years of education: Not on file  . Highest education level: Not on file  Occupational History  . Not on file  Tobacco Use  . Smoking status: Never Smoker  . Smokeless tobacco: Never Used  Vaping  Use  . Vaping Use: Never used  Substance and Sexual Activity  . Alcohol use: No  . Drug use: No  . Sexual activity: Yes    Birth control/protection: None  Other Topics Concern  . Not on file  Social History Narrative   Live with husband [name] and 2 children in [place].    Social Determinants of Health   Financial Resource Strain:   . Difficulty of Paying Living Expenses: Not on file  Food Insecurity:   . Worried About Programme researcher, broadcasting/film/video in the Last Year: Not on file  . Ran Out of Food in the Last Year: Not on file  Transportation Needs:   . Lack of Transportation (Medical): Not on file  . Lack of Transportation (Non-Medical): Not on file  Physical Activity:   . Days of Exercise per Week: Not on file  . Minutes of Exercise per Session: Not on file  Stress:   . Feeling of Stress : Not on file  Social Connections:   . Frequency of Communication with Friends and Family: Not on file  . Frequency of Social Gatherings with Friends and Family: Not on file  . Attends Religious Services: Not on file  . Active Member of Clubs or Organizations: Not on file  . Attends Banker Meetings: Not on file  . Marital Status: Not on file  Intimate Partner Violence:   . Fear of Current or  Ex-Partner: Not on file  . Emotionally Abused: Not on file  . Physically Abused: Not on file  . Sexually Abused: Not on file    No Known Allergies  Prior to Admission medications   Medication Sig Start Date End Date Taking? Authorizing Provider  levothyroxine (SYNTHROID) 25 MCG tablet Take 25 mcg by mouth daily. 10/18/19  Yes [provider]  Prenatal MV & Min w/FA-DHA (PRENATAL ADULT GUMMY/DHA/FA PO) Take by mouth. Patient not taking: Reported on 08/03/2020    [provider]    Review of Systems  Constitutional: Negative.   HENT: Negative.   Eyes: Negative.   Respiratory: Negative.   Cardiovascular: Negative.   Gastrointestinal: Negative.   Genitourinary:  Negative.   Musculoskeletal: Negative.   Skin: Negative.   Neurological: Negative.   Psychiatric/Behavioral: Negative.      Physical Exam BP 102/64   Ht 5\' 2"  (1.575 m)   Wt 161 lb (73 kg)   BMI 29.45 kg/m  No LMP recorded. Physical Exam Constitutional:      General: She is not in acute distress.    Appearance: Normal appearance. She is well-developed.  HENT:     Head: Normocephalic and atraumatic.  Eyes:     General: No scleral icterus.    Conjunctiva/sclera: Conjunctivae normal.  Cardiovascular:     Rate and Rhythm: Normal rate and regular rhythm.     Heart sounds: No murmur heard.  No friction rub. No gallop.   Pulmonary:     Effort: Pulmonary effort is normal. No respiratory distress.     Breath sounds: Normal breath sounds. No wheezing or rales.  Abdominal:     General: Bowel sounds are normal. There is no distension.     Palpations: Abdomen is soft. There is no mass.     Tenderness: There is no abdominal tenderness. There is no guarding or rebound.     Comments: without erythema, induration, warmth, and tenderness. It is clean, dry, and intact.    Musculoskeletal:        General: Normal range of motion.     Cervical back: Normal range of motion and neck supple.  Neurological:     General: No focal deficit present.     Mental Status: She is alert and oriented to person, place, and time.     Cranial Nerves: No cranial nerve deficit.  Skin:    General: Skin is warm and dry.     Findings: No erythema.  Psychiatric:        Mood and Affect: Mood normal.        Behavior: Behavior normal.        Judgment: Judgment normal.    Female chaperone present for pelvic and breast  portions of the physical exam  Edinburgh: 7  Assessment: 29 y.o. 26 female here for  1. Postpartum care and examination      Plan: Problem List Items Addressed This Visit    None    Visit Diagnoses    Postpartum care and examination    -  Primary     No evidence of postpartum  depression today. Continue to monitor symptoms.  Will see for 6 week postpartum visit in 4 weeks.  [id number], MD 08/03/2020 3:26 PM

## 2020-08-31 ENCOUNTER — Other Ambulatory Visit: Payer: Self-pay

## 2020-08-31 ENCOUNTER — Ambulatory Visit (INDEPENDENT_AMBULATORY_CARE_PROVIDER_SITE_OTHER): Payer: Medicaid Other | Admitting: Obstetrics and Gynecology

## 2020-08-31 ENCOUNTER — Encounter: Payer: Self-pay | Admitting: Obstetrics and Gynecology

## 2020-08-31 DIAGNOSIS — E039 Hypothyroidism, unspecified: Secondary | ICD-10-CM | POA: Diagnosis not present

## 2020-08-31 NOTE — Progress Notes (Signed)
Postpartum Visit  Chief Complaint:  Chief Complaint  Patient presents with   Postpartum Care    History of Present Illness: Patient is a 29 y.o. [id number] presents for postpartum visit.  Date of delivery: 07/17/2020 Type of delivery: Vaginal delivery - Vacuum or forceps assisted  no Episiotomy No.  Laceration: 2nd degree Pregnancy or labor problems: Hypothyroidism Any problems since the delivery:  no  Newborn Details:  SINGLETON :  1. Baby's name: Anette Riedel. Birth weight: 8.13lb Maternal Details:  Breast Feeding:  Breast and Bottle Post partum depression/anxiety noted:  no Edinburgh Post-Partum Depression Score:  6  Date of last PAP: 11/30/2019, NILM  Past Medical History:  Diagnosis Date   Anxiety    Depression    Gestational diabetes    Hypothyroidism    Strep pharyngitis 12/15/2017    Past Surgical History:  Procedure Laterality Date   CHOLECYSTECTOMY N/A 11/25/2016   Procedure: LAPAROSCOPIC CHOLECYSTECTOMY WITH INTRAOPERATIVE CHOLANGIOGRAM;  Surgeon: Manus Rudd, MD;  Location: [facility];  Service: General;  Laterality: N/A;   DILATION AND CURETTAGE OF UTERUS  11/2018   SAB   INTRAUTERINE DEVICE (IUD) INSERTION     TUBAL LIGATION Bilateral 07/18/2020   Procedure: POST PARTUM TUBAL LIGATION;  Surgeon: Conard Novak, MD;  Location: [facility] ORS;  Service: Gynecology;  Laterality: Bilateral;    Prior to Admission medications   Medication Sig Start Date End Date Taking? Authorizing Provider  levothyroxine (SYNTHROID) 25 MCG tablet Take 25 mcg by mouth daily. 10/18/19   [provider]  Prenatal MV & Min w/FA-DHA (PRENATAL ADULT GUMMY/DHA/FA PO) Take by mouth. Patient not taking: Reported on 08/03/2020    [provider]    No Known Allergies   Social History   Socioeconomic History   Marital status: Married    Spouse name: [name]   Number of children: 2   Years of education: Not on file   Highest education level: Not on file    Occupational History   Not on file  Tobacco Use   Smoking status: Never Smoker   Smokeless tobacco: Never Used  Vaping Use   Vaping Use: Never used  Substance and Sexual Activity   Alcohol use: No   Drug use: No   Sexual activity: Yes    Birth control/protection: None  Other Topics Concern   Not on file  Social History Narrative   Live with husband [name] and 2 children in [place].    Social Determinants of Health   Financial Resource Strain:    Difficulty of Paying Living Expenses: Not on file  Food Insecurity:    Worried About Programme researcher, broadcasting/film/video in the Last Year: Not on file   [employer] of Food in the Last Year: Not on file  Transportation Needs:    Lack of Transportation (Medical): Not on file   Lack of Transportation (Non-Medical): Not on file  Physical Activity:    Days of Exercise per Week: Not on file   Minutes of Exercise per Session: Not on file  Stress:    Feeling of Stress : Not on file  Social Connections:    Frequency of Communication with Friends and Family: Not on file   Frequency of Social Gatherings with Friends and Family: Not on file   Attends Religious Services: Not on file   Active Member of Clubs or Organizations: Not on file   Attends Banker Meetings: Not on file   Marital Status: Not on file  Intimate Partner Violence:  Fear of Current or Ex-Partner: Not on file   Emotionally Abused: Not on file   Physically Abused: Not on file   Sexually Abused: Not on file    Family History  Problem Relation Age of Onset   Hypertension Father    Hyperlipidemia Father    Heart disease Maternal Grandfather    Cancer Maternal Grandfather    Cervical cancer Mother    ADD / ADHD Brother    Hypertension Maternal Grandmother    Cervical cancer Maternal Grandmother    Cancer Paternal Grandfather    Diabetes Paternal Grandfather    Hyperlipidemia Paternal Grandfather     Review of Systems   Constitutional: Negative.   HENT: Negative.   Eyes: Negative.   Respiratory: Negative.   Cardiovascular: Negative.   Gastrointestinal: Negative.   Genitourinary: Negative.   Musculoskeletal: Negative.   Skin: Negative.   Neurological: Negative.   Psychiatric/Behavioral: Negative.      Physical Exam BP 122/74    Ht 5\' 2"  (1.575 m)    Wt 160 lb (72.6 kg)    BMI 29.26 kg/m   Physical Exam Constitutional:      General: She is not in acute distress.    Appearance: Normal appearance. She is well-developed.  Genitourinary:     Pelvic exam was performed with patient in the lithotomy position.     Vulva, inguinal canal, urethra, bladder, vagina, uterus, right adnexa and left adnexa normal.     No posterior fourchette tenderness, injury or lesion present.     No cervical friability, lesion, bleeding or polyp.  HENT:     Head: Normocephalic and atraumatic.  Eyes:     General: No scleral icterus.    Conjunctiva/sclera: Conjunctivae normal.  Cardiovascular:     Rate and Rhythm: Normal rate and regular rhythm.     Heart sounds: No murmur heard.  No friction rub. No gallop.   Pulmonary:     Effort: Pulmonary effort is normal. No respiratory distress.     Breath sounds: Normal breath sounds. No wheezing or rales.  Abdominal:     General: Bowel sounds are normal. There is no distension.     Palpations: Abdomen is soft. There is no mass.     Tenderness: There is no abdominal tenderness. There is no guarding or rebound.     Comments: Incision from BTL without erythema, induration, warmth, and tenderness. It is clean, dry, and intact.    Musculoskeletal:        General: Normal range of motion.     Cervical back: Normal range of motion and neck supple.  Neurological:     General: No focal deficit present.     Mental Status: She is alert and oriented to person, place, and time.     Cranial Nerves: No cranial nerve deficit.  Skin:    General: Skin is warm and dry.     Findings: No  erythema.  Psychiatric:        Mood and Affect: Mood normal.        Behavior: Behavior normal.        Judgment: Judgment normal.      Female Chaperone present during breast and/or pelvic exam.  Assessment: 29 y.o. 26 presenting for 6 week postpartum visit  Plan: Problem List Items Addressed This Visit      Endocrine   Hypothyroidism (acquired)   Relevant Orders   TSH + free T4    Other Visit Diagnoses    Postpartum care and examination    -  Primary   Relevant Orders   TSH + free T4     1) Contraception: s/p BTL.  2)  Pap - ASCCP guidelines and rational discussed.  Patient opts for routine screening interval  3) Patient underwent screening for postpartum depression with no concerns noted.  4) Follow up 1 year for routine annual exam  Thomasene Mohair, MD 08/31/2020 11:58 AM

## 2020-09-01 LAB — TSH+FREE T4
Free T4: 1.43 ng/dL (ref 0.82–1.77)
TSH: 2.23 u[IU]/mL (ref 0.450–4.500)

## 2020-09-22 NOTE — Progress Notes (Signed)
°Postpartum Visit  °Chief Complaint:  °Chief Complaint  °Patient presents with  °• Postpartum Care  ° ° °History of Present Illness: Patient is a 29 y.o. G4P3013 presents for postpartum visit. ° °Date of delivery: 07/17/2020 °Type of delivery: Vaginal delivery - Vacuum or forceps assisted  no °Episiotomy No.  °Laceration: 2nd degree °Pregnancy or labor problems: Hypothyroidism °Any problems since the delivery:  no ° °Newborn Details:  °SINGLETON :  °1. Baby's name: Noah. Birth weight: 8.13lb °Maternal Details:  °Breast Feeding:  Breast and Bottle °Post partum depression/anxiety noted:  no °Edinburgh Post-Partum Depression Score:  6  °Date of last PAP: 11/30/2019, NILM ° °Past Medical History:  °Diagnosis Date  °• Anxiety   °• Depression   °• Gestational diabetes   °• Hypothyroidism   °• Strep pharyngitis 12/15/2017  ° ° °Past Surgical History:  °Procedure Laterality Date  °• CHOLECYSTECTOMY N/A 11/25/2016  ° Procedure: LAPAROSCOPIC CHOLECYSTECTOMY WITH INTRAOPERATIVE CHOLANGIOGRAM;  Surgeon: Matthew Tsuei, MD;  Location: [facility];  Service: General;  Laterality: N/A;  °• DILATION AND CURETTAGE OF UTERUS  11/2018  ° SAB  °• INTRAUTERINE DEVICE (IUD) INSERTION    °• TUBAL LIGATION Bilateral 07/18/2020  ° Procedure: POST PARTUM TUBAL LIGATION;  Surgeon: Bradford Cazier D, MD;  Location: [facility] ORS;  Service: Gynecology;  Laterality: Bilateral;  ° ° °Prior to Admission medications   °Medication Sig Start Date End Date Taking? Authorizing Provider  °levothyroxine (SYNTHROID) 25 MCG tablet Take 25 mcg by mouth daily. 10/18/19   [provider]  °Prenatal MV & Min w/FA-DHA (PRENATAL ADULT GUMMY/DHA/FA PO) Take by mouth. °Patient not taking: Reported on 08/03/2020    [provider]  ° ° °No Known Allergies  ° °Social History  ° °Socioeconomic History  °• Marital status: Married  °  Spouse name: [name]  °• Number of children: 2  °• Years of education: Not on file  °• Highest education level: Not on file    °Occupational History  °• Not on file  °Tobacco Use  °• Smoking status: Never Smoker  °• Smokeless tobacco: Never Used  °Vaping Use  °• Vaping Use: Never used  °Substance and Sexual Activity  °• Alcohol use: No  °• Drug use: No  °• Sexual activity: Yes  °  Birth control/protection: None  °Other Topics Concern  °• Not on file  °Social History Narrative  ° Live with husband [name] and 2 children in [place].   ° °Social Determinants of Health  ° °Financial Resource Strain:   °• Difficulty of Paying Living Expenses: Not on file  °Food Insecurity:   °• Worried About Running Out of Food in the Last Year: Not on file  °• Ran Out of Food in the Last Year: Not on file  °Transportation Needs:   °• Lack of Transportation (Medical): Not on file  °• Lack of Transportation (Non-Medical): Not on file  °Physical Activity:   °• Days of Exercise per Week: Not on file  °• Minutes of Exercise per Session: Not on file  °Stress:   °• Feeling of Stress : Not on file  °Social Connections:   °• Frequency of Communication with Friends and Family: Not on file  °• Frequency of Social Gatherings with Friends and Family: Not on file  °• Attends Religious Services: Not on file  °• Active Member of Clubs or Organizations: Not on file  °• Attends Club or Organization Meetings: Not on file  °• Marital Status: Not on file  °Intimate Partner Violence:   °•   Fear of Current or Ex-Partner: Not on file  °• Emotionally Abused: Not on file  °• Physically Abused: Not on file  °• Sexually Abused: Not on file  ° ° °Family History  °Problem Relation Age of Onset  °• Hypertension Father   °• Hyperlipidemia Father   °• Heart disease Maternal Grandfather   °• Cancer Maternal Grandfather   °• Cervical cancer Mother   °• ADD / ADHD Brother   °• Hypertension Maternal Grandmother   °• Cervical cancer Maternal Grandmother   °• Cancer Paternal Grandfather   °• Diabetes Paternal Grandfather   °• Hyperlipidemia Paternal Grandfather   ° ° °Review of Systems   °Constitutional: Negative.   °HENT: Negative.   °Eyes: Negative.   °Respiratory: Negative.   °Cardiovascular: Negative.   °Gastrointestinal: Negative.   °Genitourinary: Negative.   °Musculoskeletal: Negative.   °Skin: Negative.   °Neurological: Negative.   °Psychiatric/Behavioral: Negative.   °  ° °Physical Exam °BP 122/74    Ht 5' 2" (1.575 m)    Wt 160 lb (72.6 kg)    BMI 29.26 kg/m²   °Physical Exam °Constitutional:   °   General: She is not in acute distress. °   Appearance: Normal appearance. She is well-developed.  °Genitourinary:  °   Pelvic exam was performed with patient in the lithotomy position.  °   Vulva, inguinal canal, urethra, bladder, vagina, uterus, right adnexa and left adnexa normal.  °   No posterior fourchette tenderness, injury or lesion present.  °   No cervical friability, lesion, bleeding or polyp.  °HENT:  °   Head: Normocephalic and atraumatic.  °Eyes:  °   General: No scleral icterus. °   Conjunctiva/sclera: Conjunctivae normal.  °Cardiovascular:  °   Rate and Rhythm: Normal rate and regular rhythm.  °   Heart sounds: No murmur heard.  °No friction rub. No gallop.   °Pulmonary:  °   Effort: Pulmonary effort is normal. No respiratory distress.  °   Breath sounds: Normal breath sounds. No wheezing or rales.  °Abdominal:  °   General: Bowel sounds are normal. There is no distension.  °   Palpations: Abdomen is soft. There is no mass.  °   Tenderness: There is no abdominal tenderness. There is no guarding or rebound.  °   Comments: Incision from BTL without erythema, induration, warmth, and tenderness. It is clean, dry, and intact.    °Musculoskeletal:     °   General: Normal range of motion.  °   Cervical back: Normal range of motion and neck supple.  °Neurological:  °   General: No focal deficit present.  °   Mental Status: She is alert and oriented to person, place, and time.  °   Cranial Nerves: No cranial nerve deficit.  °Skin: °   General: Skin is warm and dry.  °   Findings: No  erythema.  °Psychiatric:     °   Mood and Affect: Mood normal.     °   Behavior: Behavior normal.     °   Judgment: Judgment normal.  ° ° ° ° °Female Chaperone present during breast and/or pelvic exam. ° °Assessment: 29 y.o. G4P3013 presenting for 6 week postpartum visit ° °Plan: °Problem List Items Addressed This Visit   °  ° Endocrine  ° Hypothyroidism (acquired)  ° Relevant Orders  ° TSH + free T4  °  °Other Visit Diagnoses   ° Postpartum care and examination    -    Primary  ° Relevant Orders  ° TSH + free T4  °  ° °1) Contraception: s/p BTL. ° °2)  Pap °- ASCCP guidelines and rational discussed.  Patient opts for routine screening interval ° °3) Patient underwent screening for postpartum depression with no concerns noted. ° °4) Follow up 1 year for routine annual exam ° °Tomas Schamp, MD °08/31/2020 11:58 AM   °

## 2021-12-02 DIAGNOSIS — S5001XA Contusion of right elbow, initial encounter: Secondary | ICD-10-CM | POA: Diagnosis not present

## 2021-12-02 DIAGNOSIS — M25521 Pain in right elbow: Secondary | ICD-10-CM | POA: Diagnosis not present

## 2021-12-26 DIAGNOSIS — R070 Pain in throat: Secondary | ICD-10-CM | POA: Diagnosis not present

## 2021-12-26 DIAGNOSIS — J020 Streptococcal pharyngitis: Secondary | ICD-10-CM | POA: Diagnosis not present

## 2022-03-25 IMAGING — CT CT ANGIO CHEST
2 of 6 series · 18 of 46 positions shown · IV contrast (APPLIED)
Comparison: None.

CLINICAL DATA: Left-sided stabbing chest pain radiating to left
side of back, symptoms for 1 day, pregnant

EXAM:
CT ANGIOGRAPHY CHEST WITH CONTRAST
TECHNIQUE: Multidetector CT imaging of the chest was performed using the
standard protocol during bolus administration of intravenous
contrast. Multiplanar CT image reconstructions and MIPs were
obtained to evaluate the vascular anatomy.
CONTRAST:  75mL OMNIPAQUE IOHEXOL 350 MG/ML SOLN

[Series 5: thins · axial · 0.60mm/px · z∈[+0,+200]mm · 15 of 220 slices shown]
[im 10/220  lung]
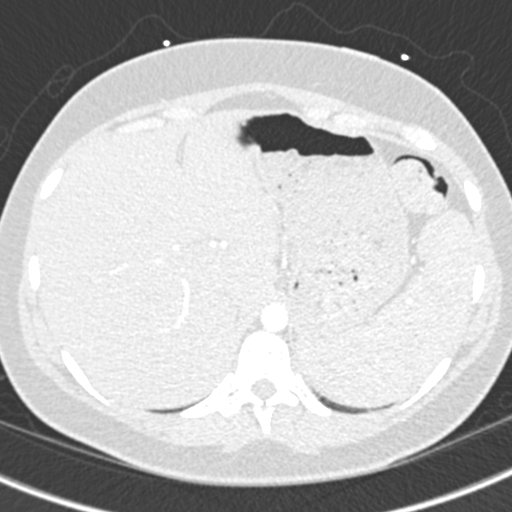
[im 29/220  soft-tissue]
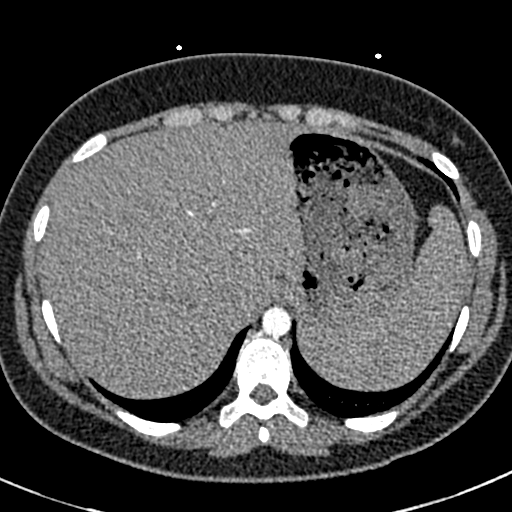
[im 39/220  lung]
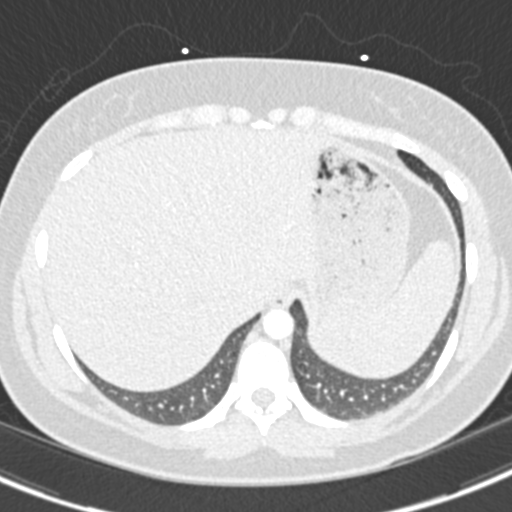
[im 58/220  soft-tissue]
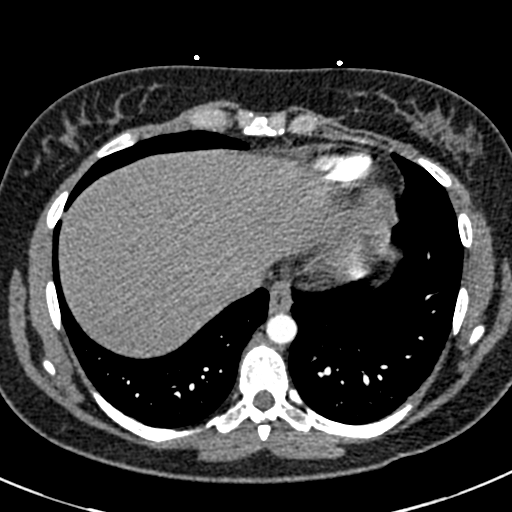
[im 67/220  lung]
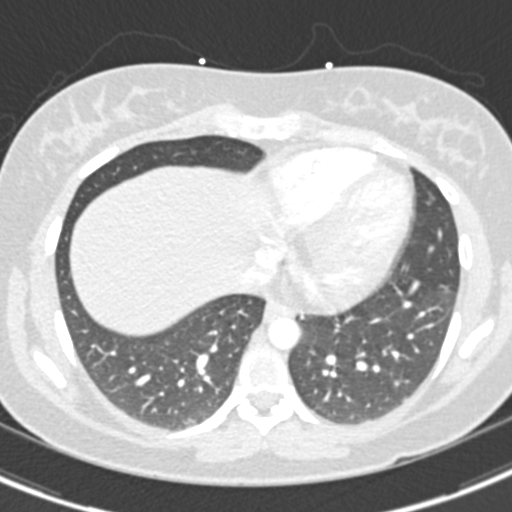
[im 86/220  soft-tissue]
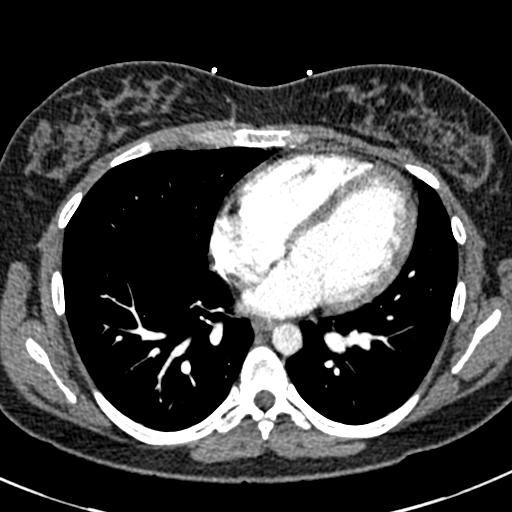
[im 96/220  lung]
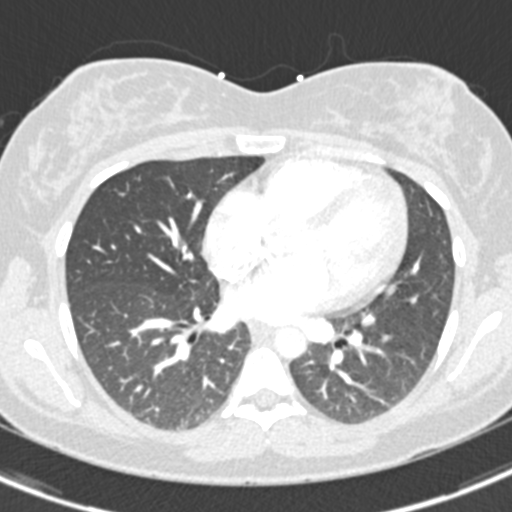
[im 115/220  soft-tissue]
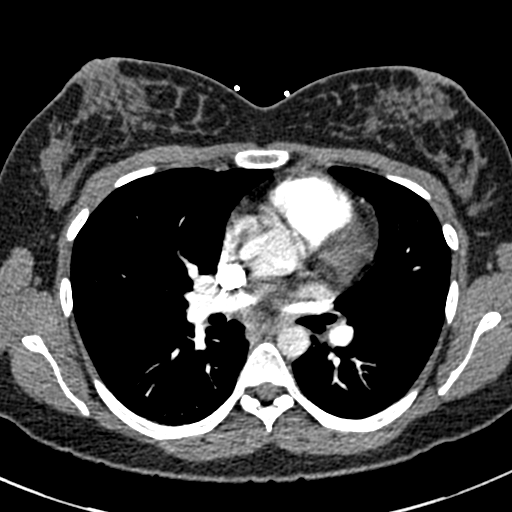
[im 124/220  lung]
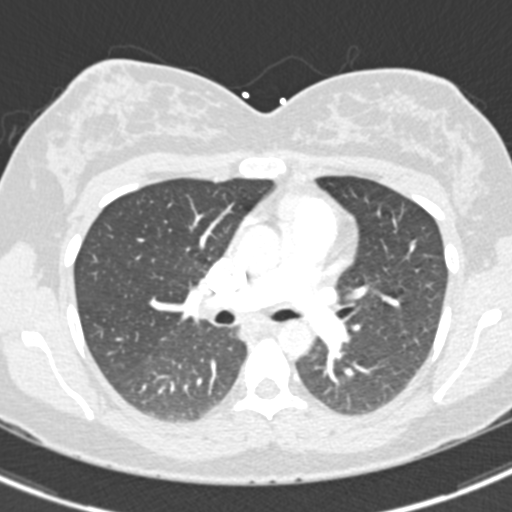
[im 134/220  soft-tissue]
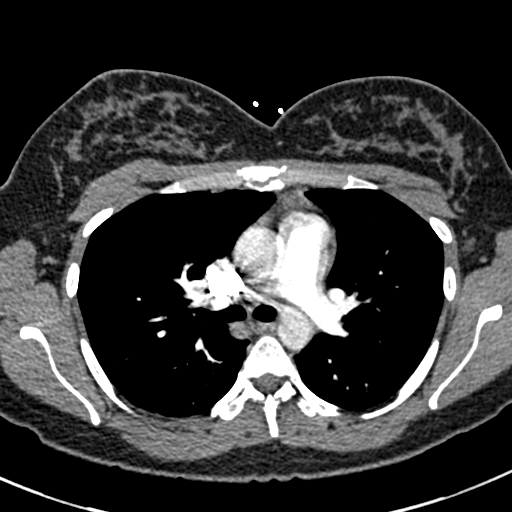
[im 153/220  lung]
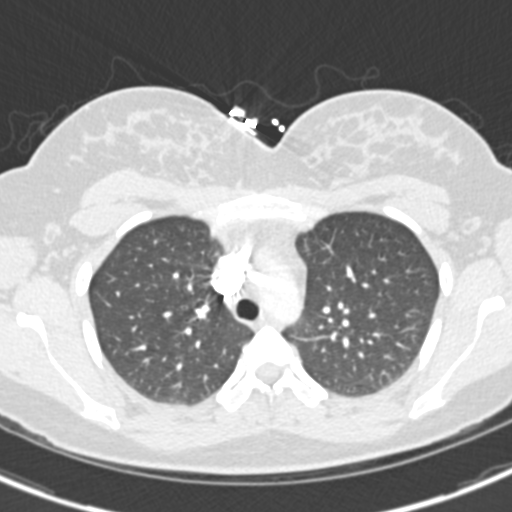
[im 162/220  soft-tissue]
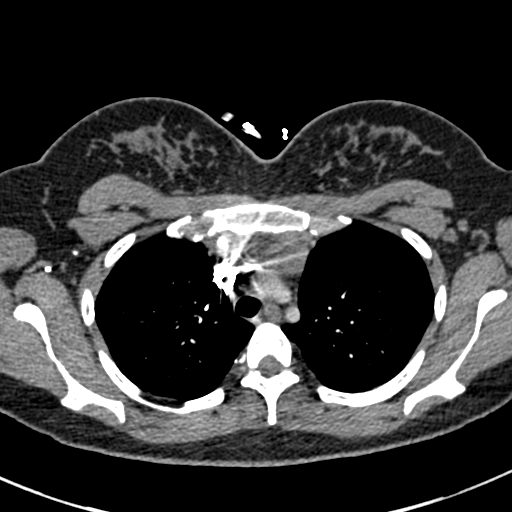
[im 181/220  lung]
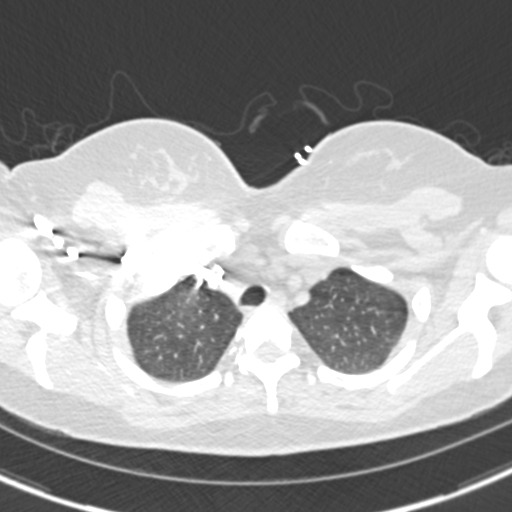
[im 191/220  soft-tissue]
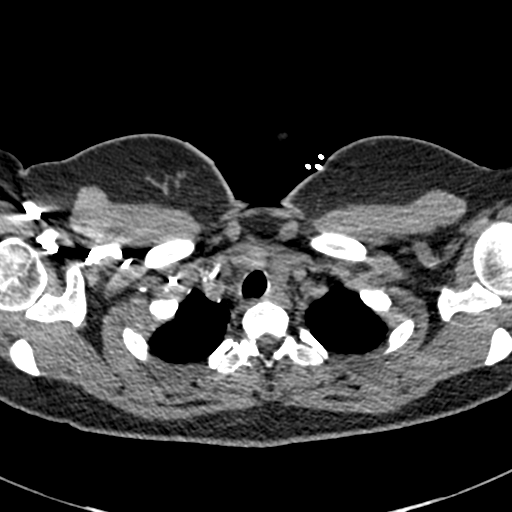
[im 210/220  lung]
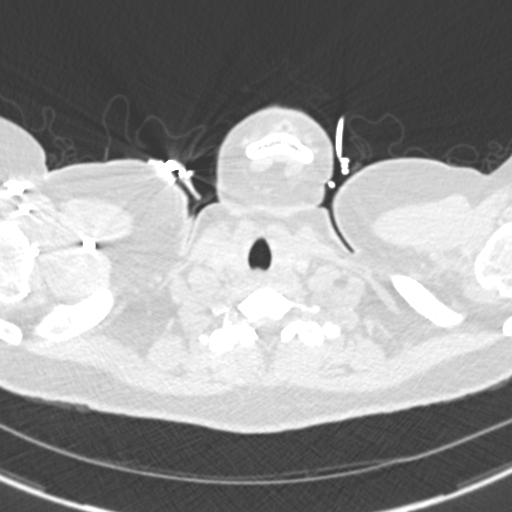

[Series 7: coronal mpr · coronal · 0.43mm/px · 3 of 70 slices shown]
[im 18/70  soft-tissue]
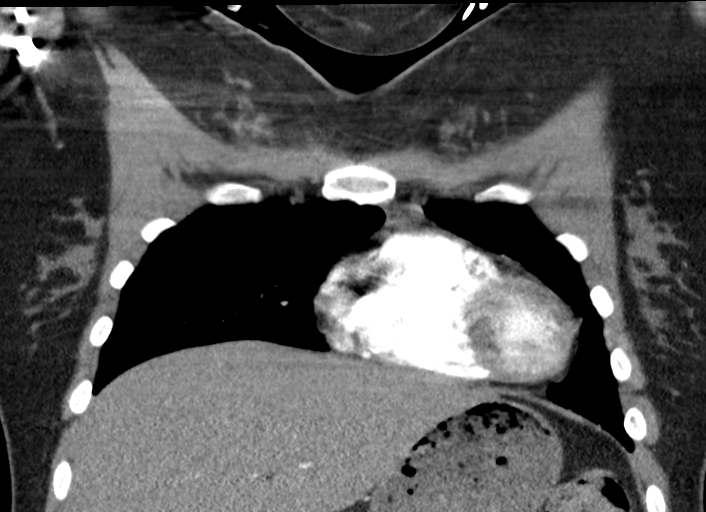
[im 35/70  soft-tissue]
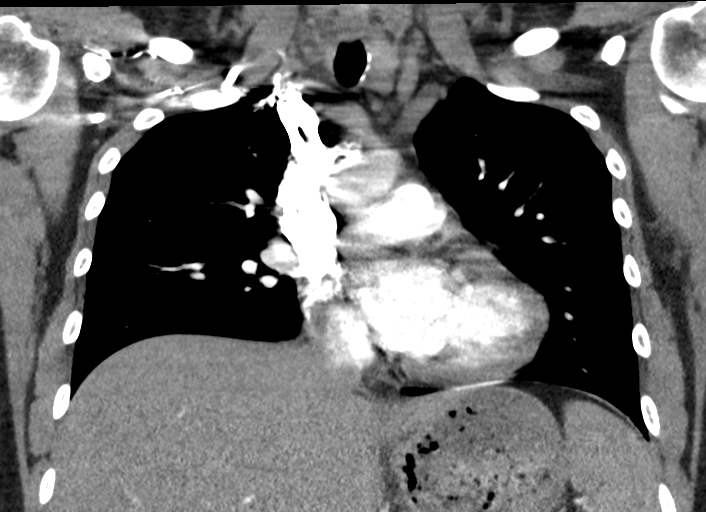
[im 52/70  soft-tissue]
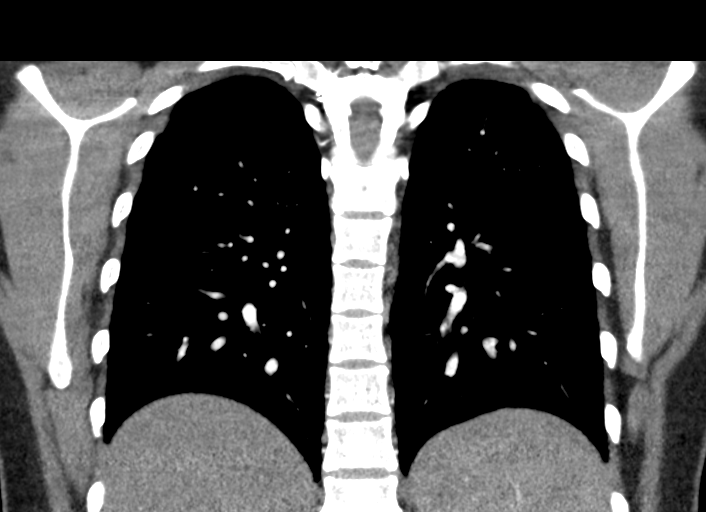

[18 of 46 positions shown; findings below may reference images not displayed]

FINDINGS: Cardiovascular: This is a technically adequate evaluation of the
pulmonary vasculature. No filling defects or pulmonary emboli.

The heart is unremarkable without pericardial effusion. Thoracic
aorta is normal in caliber.

Mediastinum/Nodes: No enlarged mediastinal, hilar, or axillary lymph
nodes. Thyroid gland, trachea, and esophagus demonstrate no
significant findings.

Lungs/Pleura: No airspace disease, effusion, or pneumothorax.
Central airways are patent.

Upper Abdomen: No acute abnormality.

Musculoskeletal: No acute or destructive bony lesions. Reconstructed
images demonstrate no additional findings.

Review of the MIP images confirms the above findings.
IMPRESSION: 1. No evidence of pulmonary embolus.
2. No acute intrathoracic process.

## 2022-06-06 ENCOUNTER — Encounter (HOSPITAL_COMMUNITY): Payer: Self-pay | Admitting: Emergency Medicine

## 2022-06-06 ENCOUNTER — Emergency Department (HOSPITAL_COMMUNITY)
Admission: EM | Admit: 2022-06-06 | Discharge: 2022-06-06 | Disposition: A | Discharge status: Home / Self Care | Payer: Medicaid Other | Attending: Emergency Medicine | Admitting: Emergency Medicine

## 2022-06-06 ENCOUNTER — Emergency Department (HOSPITAL_COMMUNITY): Payer: Medicaid Other

## 2022-06-06 ENCOUNTER — Other Ambulatory Visit: Payer: Self-pay

## 2022-06-06 DIAGNOSIS — R0602 Shortness of breath: Secondary | ICD-10-CM | POA: Diagnosis not present

## 2022-06-06 DIAGNOSIS — R079 Chest pain, unspecified: Secondary | ICD-10-CM | POA: Insufficient documentation

## 2022-06-06 DIAGNOSIS — Z5321 Procedure and treatment not carried out due to patient leaving prior to being seen by health care provider: Secondary | ICD-10-CM | POA: Insufficient documentation

## 2022-06-06 LAB — BASIC METABOLIC PANEL
Anion gap: 5 (ref 5–15)
BUN: 7 mg/dL (ref 6–20)
CO2: 26 mmol/L (ref 22–32)
Calcium: 9.2 mg/dL (ref 8.9–10.3)
Chloride: 105 mmol/L (ref 98–111)
Creatinine, Ser: 0.81 mg/dL (ref 0.44–1.00)
GFR, Estimated: >60 mL/min (ref >60)
Glucose, Bld: 132 mg/dL — ABNORMAL HIGH (ref 70–99)
Potassium: 3.9 mmol/L (ref 3.5–5.1)
Sodium: 136 mmol/L (ref 135–145)

## 2022-06-06 LAB — CBC
HCT: 37.9 % (ref 36.0–46.0)
Hemoglobin: 12.4 g/dL (ref 12.0–15.0)
MCH: 26.8 pg (ref 26.0–34.0)
MCHC: 32.7 g/dL (ref 30.0–36.0)
MCV: 81.9 fL (ref 80.0–100.0)
Platelets: 371 10*3/uL (ref 150–400)
RBC: 4.63 MIL/uL (ref 3.87–5.11)
RDW: 12.8 % (ref 11.5–15.5)
WBC: 8.4 10*3/uL (ref 4.0–10.5)
nRBC: 0 % (ref 0.0–0.2)

## 2022-06-06 LAB — TROPONIN I (HIGH SENSITIVITY): Troponin I (High Sensitivity): <2 ng/L (ref <18)

## 2022-06-06 NOTE — ED Triage Notes (Addendum)
Pt presents with CP w/SOB x 1 hour.  Pt took Tylenol #3 approx 2.5 hrs ago for wisdom tooth pulled yesterday, first time taking.

## 2023-02-19 ENCOUNTER — Encounter: Payer: Self-pay | Admitting: Internal Medicine

## 2023-02-19 ENCOUNTER — Ambulatory Visit (INDEPENDENT_AMBULATORY_CARE_PROVIDER_SITE_OTHER): Payer: Medicaid Other | Admitting: Internal Medicine

## 2023-02-19 VITALS — BP 116/74 | HR 100 | Temp 98.2°F | Resp 16 | Ht 62.0 in | Wt 135.3 lb

## 2023-02-19 DIAGNOSIS — R718 Other abnormality of red blood cells: Secondary | ICD-10-CM

## 2023-02-19 DIAGNOSIS — Z1159 Encounter for screening for other viral diseases: Secondary | ICD-10-CM

## 2023-02-19 DIAGNOSIS — B001 Herpesviral vesicular dermatitis: Secondary | ICD-10-CM

## 2023-02-19 DIAGNOSIS — E039 Hypothyroidism, unspecified: Secondary | ICD-10-CM

## 2023-02-19 DIAGNOSIS — Z8632 Personal history of gestational diabetes: Secondary | ICD-10-CM

## 2023-02-19 DIAGNOSIS — Z13220 Encounter for screening for lipoid disorders: Secondary | ICD-10-CM

## 2023-02-19 DIAGNOSIS — R790 Abnormal level of blood mineral: Secondary | ICD-10-CM

## 2023-02-19 MED ORDER — VALACYCLOVIR HCL 500 MG PO TABS: 500.0000 mg | ORAL_TABLET | Freq: Every day | ORAL | 1 refills | Status: DC | PRN

## 2023-02-19 NOTE — Progress Notes (Signed)
New Patient Office Visit  Subjective    Patient ID: Abigail Lowe, female    DOB: 08/07/1991  Age: 32 y.o. MRN: [id number]  CC:  Chief Complaint  Patient presents with   Establish Care    labs    HPI Abigail Lowe presents to establish care.  Hx of Hypothyroidism: -Had been on Levothyroxine 25 mcg in the past, not on currently -Was diagnosed by labs in 2019 -TSH 2.230, free T4 1.43 in 2021 (last checked) -Does have fatigue and a few other vague symptoms of hypothyroidism. Was able to lose weight after having her baby without issue.  History of Gestational DM: -In first pregnancy in 2015, sugars normal in second pregnancy in 2021. -Glucose 132 on last labs in August, uncertain if fasting or not then.  Health Maintenance: -Blood work due -Pap 2/21 negative    Outpatient Encounter Medications as of 02/19/2023  Medication Sig   [DISCONTINUED] levothyroxine (SYNTHROID) 25 MCG tablet Take 25 mcg by mouth daily.   [DISCONTINUED] Prenatal MV & Min w/FA-DHA (PRENATAL ADULT GUMMY/DHA/FA PO) Take by mouth. (Patient not taking: Reported on 08/03/2020)   No facility-administered encounter medications on file as of 02/19/2023.    Past Medical History:  Diagnosis Date   Anxiety    Depression    Gestational diabetes    Hypothyroidism    Strep pharyngitis 12/15/2017    Past Surgical History:  Procedure Laterality Date   CHOLECYSTECTOMY N/A 11/25/2016   Procedure: LAPAROSCOPIC CHOLECYSTECTOMY WITH INTRAOPERATIVE CHOLANGIOGRAM;  Surgeon: Manus Rudd, MD;  Location: [facility] OR;  Service: General;  Laterality: N/A;   DILATION AND CURETTAGE OF UTERUS  11/2018   SAB   INTRAUTERINE DEVICE (IUD) INSERTION     TUBAL LIGATION Bilateral 07/18/2020   Procedure: POST PARTUM TUBAL LIGATION;  Surgeon: Conard Novak, MD;  Location: [facility] ORS;  Service: Gynecology;  Laterality: Bilateral;    Family History  Problem Relation Age of Onset   Hypertension Father    Hyperlipidemia Father     Heart disease Maternal Grandfather    Cancer Maternal Grandfather    Cervical cancer Mother    ADD / ADHD Brother    Hypertension Maternal Grandmother    Cervical cancer Maternal Grandmother    Cancer Paternal Grandfather    Diabetes Paternal Grandfather    Hyperlipidemia Paternal Grandfather     Social History   Socioeconomic History   Marital status: Married    Spouse name: [name]   Number of children: 2   Years of education: Not on file   Highest education level: Not on file  Occupational History   Not on file  Tobacco Use   Smoking status: Never   Smokeless tobacco: Never  Vaping Use   Vaping Use: Never used  Substance and Sexual Activity   Alcohol use: No   Drug use: No   Sexual activity: Yes    Birth control/protection: Surgical, None  Other Topics Concern   Not on file  Social History Narrative   Live with husband [name] and 2 children in [place].    Social Determinants of Health   Financial Resource Strain: Low Risk  (02/08/2018)   Overall Financial Resource Strain (CARDIA)    Difficulty of Paying Living Expenses: Not hard at all  Food Insecurity: No Food Insecurity (02/08/2018)   Hunger Vital Sign    Worried About Running Out of Food in the Last Year: Never true    Ran Out of Food in the Last Year: Never true  Transportation Needs: No Transportation Needs (02/08/2018)   PRAPARE - Administrator, Civil Service (Medical): No    Lack of Transportation (Non-Medical): No  Physical Activity: Inactive (02/08/2018)   Exercise Vital Sign    Days of Exercise per Week: 0 days    Minutes of Exercise per Session: 0 min  Stress: Stress Concern Present (02/08/2018)   [employer] of Occupational Health - Occupational Stress Questionnaire    Feeling of Stress : Rather much  Social Connections: Socially Integrated (02/08/2018)   Social Connection and Isolation Panel [NHANES]    Frequency of Communication with Friends and Family: More than three times a week     Frequency of Social Gatherings with Friends and Family: Once a week    Attends Religious Services: More than 4 times per year    Active Member of [employer] or Organizations: Yes    Attends Banker Meetings: More than 4 times per year    Marital Status: Married  Catering manager Violence: Not At Risk (02/08/2018)   Humiliation, Afraid, Rape, and Kick questionnaire    Fear of Current or Ex-Partner: No    Emotionally Abused: No    Physically Abused: No    Sexually Abused: No    Review of Systems  Constitutional:  Positive for malaise/fatigue.  All other systems reviewed and are negative.       Objective    BP 116/74   Pulse 100   Temp 98.2 F (36.8 C)   Resp 16   Ht 5\' 2"  (1.575 m)   Wt 135 lb 4.8 oz (61.4 kg)   LMP 02/02/2023 (Approximate)   SpO2 100%   BMI 24.75 kg/m   Physical Exam Constitutional:      Appearance: Normal appearance.  HENT:     Head: Normocephalic and atraumatic.     Mouth/Throat:     Mouth: Mucous membranes are moist.     Pharynx: Oropharynx is clear.     Comments: Cold sore present  Eyes:     Conjunctiva/sclera: Conjunctivae normal.  Neck:     Comments: No thyromegaly Cardiovascular:     Rate and Rhythm: Normal rate and regular rhythm.  Pulmonary:     Effort: Pulmonary effort is normal.     Breath sounds: Normal breath sounds.  Musculoskeletal:     Cervical back: No tenderness.     Right lower leg: No edema.     Left lower leg: No edema.  Lymphadenopathy:     Cervical: No cervical adenopathy.  Skin:    General: Skin is warm and dry.  Neurological:     General: No focal deficit present.     Mental Status: She is alert. Mental status is at baseline.  Psychiatric:        Mood and Affect: Mood normal.        Behavior: Behavior normal.         Assessment & Plan:   1. Hypothyroidism (acquired): Not currently on medication, will recheck labs today.   - CBC w/Diff/Platelet - Thyroid Panel With TSH  2. History of  gestational diabetes: Recheck fasting sugar today along with full panel.   - COMPLETE METABOLIC PANEL WITH GFR  3. Cold sore: Prescribe Valtrex to take as needed.  - valACYclovir (VALTREX) 500 MG tablet; Take 1 tablet (500 mg total) by mouth daily as needed (cold sore).  Dispense: 30 tablet; Refill: 1  4. Lipid screening/Encounter for hepatitis C screening test for low risk patient: Screening labs  due.   - Lipid Profile - Hepatitis C Antibody   Return in about 1 year (around 02/19/2024) for can schedule CPE sooner for Pap if she wants or we can wait until next year.   Margarita Mail, DO

## 2023-02-19 NOTE — Patient Instructions (Signed)
It was great seeing you today!  Plan discussed at today's visit: -Blood work ordered today, results will be uploaded to MyChart.  -Valtrex sent to pharmacy to take as needed for cold sores - try to take right when symptoms start  Follow up in: 1 year or sooner for Pap  Take care and let us know if you have any questions or concerns prior to your next visit.  Dr. Caralee Ates

## 2023-02-20 NOTE — Addendum Note (Signed)
Addended by: Margarita Mail on: 02/20/2023 12:25 PM   Modules accepted: Orders

## 2023-02-26 LAB — CBC WITH DIFFERENTIAL/PLATELET
Absolute Monocytes: 437 cells/uL (ref 200–950)
Basophils Absolute: 72 cells/uL (ref 0–200)
Basophils Relative: 1.5 %
Eosinophils Absolute: 221 cells/uL (ref 15–500)
Eosinophils Relative: 4.6 %
HCT: 37.7 % (ref 35.0–45.0)
Hemoglobin: 11.8 g/dL (ref 11.7–15.5)
Lymphs Abs: 1882 cells/uL (ref 850–3900)
MCH: 22.8 pg — ABNORMAL LOW (ref 27.0–33.0)
MCHC: 31.3 g/dL — ABNORMAL LOW (ref 32.0–36.0)
MCV: 72.8 fL — ABNORMAL LOW (ref 80.0–100.0)
MPV: 10.5 fL (ref 7.5–12.5)
Monocytes Relative: 9.1 %
Neutro Abs: 2189 cells/uL (ref 1500–7800)
Neutrophils Relative %: 45.6 %
Platelets: 316 10*3/uL (ref 140–400)
RBC: 5.18 10*6/uL — ABNORMAL HIGH (ref 3.80–5.10)
RDW: 15.8 % — ABNORMAL HIGH (ref 11.0–15.0)
Total Lymphocyte: 39.2 %
WBC: 4.8 10*3/uL (ref 3.8–10.8)

## 2023-02-26 LAB — TEST AUTHORIZATION

## 2023-02-26 LAB — COMPLETE METABOLIC PANEL WITH GFR
AG Ratio: 1.5 (calc) (ref 1.0–2.5)
ALT: 9 U/L (ref 6–29)
AST: 13 U/L (ref 10–30)
Albumin: 4.5 g/dL (ref 3.6–5.1)
Alkaline phosphatase (APISO): 54 U/L (ref 31–125)
BUN: 9 mg/dL (ref 7–25)
CO2: 26 mmol/L (ref 20–32)
Calcium: 9.7 mg/dL (ref 8.6–10.2)
Chloride: 104 mmol/L (ref 98–110)
Creat: 0.83 mg/dL (ref 0.50–0.97)
Globulin: 3 g/dL (calc) (ref 1.9–3.7)
Glucose, Bld: 96 mg/dL (ref 65–99)
Potassium: 4.7 mmol/L (ref 3.5–5.3)
Sodium: 139 mmol/L (ref 135–146)
Total Bilirubin: 0.3 mg/dL (ref 0.2–1.2)
Total Protein: 7.5 g/dL (ref 6.1–8.1)
eGFR: 97 mL/min/{1.73_m2} (ref >=60)

## 2023-02-26 LAB — LIPID PANEL
Cholesterol: 161 mg/dL (ref <200)
HDL: 59 mg/dL (ref >=50)
LDL Cholesterol (Calc): 87 mg/dL (calc)
Non-HDL Cholesterol (Calc): 102 mg/dL (calc) (ref <130)
Total CHOL/HDL Ratio: 2.7 (calc) (ref <5.0)
Triglycerides: 68 mg/dL (ref <150)

## 2023-02-26 LAB — IRON,TIBC AND FERRITIN PANEL
%SAT: 5 % (calc) — ABNORMAL LOW (ref 16–45)
Ferritin: 4 ng/mL — ABNORMAL LOW (ref 16–154)
Iron: 26 ug/dL — ABNORMAL LOW (ref 40–190)
TIBC: 476 mcg/dL (calc) — ABNORMAL HIGH (ref 250–450)

## 2023-02-26 LAB — THYROID PANEL WITH TSH
Free Thyroxine Index: 3.4 (ref 1.4–3.8)
T3 Uptake: 31 % (ref 22–35)
T4, Total: 11.1 ug/dL (ref 5.1–11.9)
TSH: 2.27 mIU/L

## 2023-02-26 LAB — HEPATITIS C ANTIBODY: Hepatitis C Ab: NONREACTIVE

## 2023-02-27 ENCOUNTER — Encounter: Payer: Self-pay | Admitting: Internal Medicine

## 2023-03-01 MED ORDER — FERROUS GLUCONATE 324 (38 FE) MG PO TABS: 324.0000 mg | ORAL_TABLET | Freq: Every day | ORAL | 3 refills | Status: AC

## 2023-03-01 NOTE — Addendum Note (Signed)
Addended by: Margarita Mail on: 03/01/2023 06:16 PM   Modules accepted: Orders

## 2023-03-23 ENCOUNTER — Encounter: Payer: Self-pay | Admitting: Internal Medicine

## 2023-04-26 NOTE — Progress Notes (Unsigned)
   Acute Office Visit  Subjective:     Patient ID: ZANAYAH SMALLS, female    DOB: 08-19-1991, 32 y.o.   MRN: [id number]  No chief complaint on file.   HPI Patient is in today for leg cramps.  LEG CRAMPS Duration: {Blank single:19197::"chronic","days","weeks","months"} Pain: {Blank single:19197::"yes","no"} Severity: {Blank single:19197::"mild","moderate","severe","1/10","2/10","3/10","4/10","5/10","6/10","7/10","8/10","9/10","10/10"}  Quality:  {Blank multiple:19196::"sharp","dull","aching","burning","cramping","ill-defined","itchy","pressure-like","pulling","shooting","sore","stabbing","tender","tearing","throbbing"} Location:  {Blank single:19197::"lower legs","calves","thighs"} Bilateral:  {Blank single:19197::"yes","no"} Onset: {Blank single:19197::"sudden","gradual"} Frequency: {Blank single:19197::"constant","intermittent","occasional","rare","every few minutes","a few times a hour","a few times a day","a few times a week","a few times a month","a few times a year"} Time of  day:   {Blank single:19197::"night time","day time","at random"} Sudden unintentional leg jerking:   {Blank single:19197::"yes","no"} Paresthesias:   {Blank single:19197::"yes","no"} Decreased sensation:  {Blank single:19197::"yes","no"} Weakness:   {Blank single:19197::"yes","no"} Insomnia:   {Blank single:19197::"yes","no"} Fatigue:   {Blank single:19197::"yes","no"} Alleviating factors:  Aggravating factors: Status: {Blank multiple:19196::"better","worse","stable","fluctuating"} Treatments attempted:   ROS      Objective:    There were no vitals taken for this visit. {Vitals History (Optional):23777}  Physical Exam  No results found for any visits on 04/27/23.      Assessment & Plan:   Problem List Items Addressed This Visit   None   No orders of the defined types were placed in this encounter.   No follow-ups on file.  Margarita Mail, DO

## 2023-04-27 ENCOUNTER — Ambulatory Visit
Admission: RE | Admit: 2023-04-27 | Discharge: 2023-04-27 | Disposition: A | Discharge status: Home / Self Care | Payer: Medicaid Other | Attending: Internal Medicine | Admitting: Internal Medicine

## 2023-04-27 ENCOUNTER — Ambulatory Visit
Admission: RE | Admit: 2023-04-27 | Discharge: 2023-04-27 | Disposition: A | Discharge status: Home / Self Care | Payer: Medicaid Other | Source: Ambulatory Visit | Attending: Internal Medicine | Admitting: Internal Medicine

## 2023-04-27 ENCOUNTER — Encounter: Payer: Self-pay | Admitting: Internal Medicine

## 2023-04-27 ENCOUNTER — Ambulatory Visit (INDEPENDENT_AMBULATORY_CARE_PROVIDER_SITE_OTHER): Payer: Medicaid Other | Admitting: Internal Medicine

## 2023-04-27 VITALS — BP 118/74 | HR 111 | Temp 98.0°F | Resp 18 | Ht 62.0 in | Wt 134.6 lb

## 2023-04-27 DIAGNOSIS — R102 Pelvic and perineal pain: Secondary | ICD-10-CM | POA: Diagnosis not present

## 2023-04-27 DIAGNOSIS — M79605 Pain in left leg: Secondary | ICD-10-CM

## 2023-04-27 DIAGNOSIS — M25552 Pain in left hip: Secondary | ICD-10-CM | POA: Diagnosis not present

## 2023-06-23 ENCOUNTER — Encounter: Payer: Self-pay | Admitting: Family Medicine

## 2023-06-23 ENCOUNTER — Ambulatory Visit (INDEPENDENT_AMBULATORY_CARE_PROVIDER_SITE_OTHER): Payer: Medicaid Other | Admitting: Family Medicine

## 2023-06-23 VITALS — BP 114/72 | HR 72 | Temp 98.1°F | Resp 16 | Ht 62.0 in | Wt 133.8 lb

## 2023-06-23 DIAGNOSIS — M25552 Pain in left hip: Secondary | ICD-10-CM

## 2023-06-23 DIAGNOSIS — M79605 Pain in left leg: Secondary | ICD-10-CM | POA: Diagnosis not present

## 2023-06-23 DIAGNOSIS — M5442 Lumbago with sciatica, left side: Secondary | ICD-10-CM | POA: Diagnosis not present

## 2023-06-23 DIAGNOSIS — R229 Localized swelling, mass and lump, unspecified: Secondary | ICD-10-CM | POA: Diagnosis not present

## 2023-06-23 MED ORDER — PREDNISONE 20 MG PO TABS: ORAL_TABLET | ORAL | 0 refills | Status: DC

## 2023-06-23 NOTE — Progress Notes (Signed)
Patient ID: Abigail Lowe, female    DOB: [birth year], 32 y.o.   MRN: [id number]  PCP: Margarita Mail, DO  Chief Complaint  Patient presents with   Mass    Right lower leg- disappeared L Lower leg has 3 masses pt has not hurt herself concerned of growth.     Subjective:   Abigail Lowe is a 32 y.o. female, presents to clinic with CC of the following:  HPI   Pain to left hip and thigh continued Nodules to lower legs coming and going, bump on right lower leg lasted a month and then resolved, still feels sore/bruised to that area She has a new nodule to left anterior shin and some nerve pain around it and shooting down her leg    Patient Active Problem List   Diagnosis Date Noted   Hypothyroidism (acquired) 08/31/2020   Encounter for sterilization 07/18/2020   Encounter for care or examination of lactating mother 07/18/2020   Postpartum care following vaginal delivery 07/18/2020   Encounter for planned induction of labor 07/17/2020   [redacted] weeks gestation of pregnancy 07/17/2020   Near syncope 06/06/2020   Shortness of breath 06/06/2020   Palpitations 06/06/2020   Orthostatic hypotension 06/01/2020   Supervision of other normal pregnancy, antepartum 11/30/2019   Hypothyroidism affecting pregnancy, antepartum 11/30/2019   Depression 12/18/2017   Generalized anxiety disorder 12/18/2017      Current Outpatient Medications:    ferrous gluconate (FERGON) 324 MG tablet, Take 1 tablet (324 mg total) by mouth daily., Disp: 90 tablet, Rfl: 3   valACYclovir (VALTREX) 500 MG tablet, Take 1 tablet (500 mg total) by mouth daily as needed (cold sore)., Disp: 30 tablet, Rfl: 1   No Known Allergies   Social History   Tobacco Use   Smoking status: Never   Smokeless tobacco: Never  Vaping Use   Vaping status: Never Used  Substance Use Topics   Alcohol use: No   Drug use: No      Chart Review Today: I personally reviewed active problem list, medication list, allergies,  family history, social history, health maintenance, notes from last encounter, lab results, imaging with the patient/caregiver today.   Review of Systems  Constitutional: Negative.   HENT: Negative.    Eyes: Negative.   Respiratory: Negative.    Cardiovascular: Negative.   Gastrointestinal: Negative.   Endocrine: Negative.   Genitourinary: Negative.   Musculoskeletal: Negative.   Skin: Negative.   Allergic/Immunologic: Negative.   Neurological: Negative.   Hematological: Negative.   Psychiatric/Behavioral: Negative.    All other systems reviewed and are negative.      Objective:   Vitals:   06/23/23 0948  BP: 114/72  Pulse: 72  Resp: 16  Temp: 98.1 F (36.7 C)  TempSrc: Oral  SpO2: 100%  Weight: 133 lb 12.8 oz (60.7 kg)  Height: 5\' 2"  (1.575 m)    Body mass index is 24.47 kg/m.  Physical Exam Vitals and nursing note reviewed.  Constitutional:      General: She is not in acute distress.    Appearance: Normal appearance. She is well-developed. She is not ill-appearing, toxic-appearing or diaphoretic.  HENT:     Head: Normocephalic and atraumatic.     Nose: Nose normal.  Eyes:     General:        Right eye: No discharge.        Left eye: No discharge.     Conjunctiva/sclera: Conjunctivae normal.  Neck:  Trachea: No tracheal deviation.  Cardiovascular:     Rate and Rhythm: Normal rate and regular rhythm.  Pulmonary:     Effort: Pulmonary effort is normal. No respiratory distress.     Breath sounds: No stridor.  Musculoskeletal:        General: Normal range of motion.     Left lower leg: No tenderness.     Comments: Left lower leg 1.5 cm area soft nonmobile nodule nontender, no erythema  Skin:    General: Skin is warm and dry.     Findings: No rash.  Neurological:     Mental Status: She is alert.     Motor: No abnormal muscle tone.     Coordination: Coordination normal.  Psychiatric:        Behavior: Behavior normal.      Results for orders  placed or performed in visit on 02/19/23  Hepatitis C Antibody  Result Value Ref Range   Hepatitis C Ab NON-REACTIVE NON-REACTIVE  CBC w/Diff/Platelet  Result Value Ref Range   WBC 4.8 3.8 - 10.8 Thousand/uL   RBC 5.18 (H) 3.80 - 5.10 Million/uL   Hemoglobin 11.8 11.7 - 15.5 g/dL   HCT 40.9 81.1 - 91.4 %   MCV 72.8 (L) 80.0 - 100.0 fL   MCH 22.8 (L) 27.0 - 33.0 pg   MCHC 31.3 (L) 32.0 - 36.0 g/dL   RDW 78.2 (H) 95.6 - 21.3 %   Platelets 316 140 - 400 Thousand/uL   MPV 10.5 7.5 - 12.5 fL   Neutro Abs 2,189 1,500 - 7,800 cells/uL   Lymphs Abs 1,882 850 - 3,900 cells/uL   Absolute Monocytes 437 200 - 950 cells/uL   Eosinophils Absolute 221 15 - 500 cells/uL   Basophils Absolute 72 0 - 200 cells/uL   Neutrophils Relative % 45.6 %   Total Lymphocyte 39.2 %   Monocytes Relative 9.1 %   Eosinophils Relative 4.6 %   Basophils Relative 1.5 %  COMPLETE METABOLIC PANEL WITH GFR  Result Value Ref Range   Glucose, Bld 96 65 - 99 mg/dL   BUN 9 7 - 25 mg/dL   Creat 0.86 5.78 - 4.69 mg/dL   eGFR 97 > OR = 60 GE/XBM/8.41L2   BUN/Creatinine Ratio SEE NOTE: 6 - 22 (calc)   Sodium 139 135 - 146 mmol/L   Potassium 4.7 3.5 - 5.3 mmol/L   Chloride 104 98 - 110 mmol/L   CO2 26 20 - 32 mmol/L   Calcium 9.7 8.6 - 10.2 mg/dL   Total Protein 7.5 6.1 - 8.1 g/dL   Albumin 4.5 3.6 - 5.1 g/dL   Globulin 3.0 1.9 - 3.7 g/dL (calc)   AG Ratio 1.5 1.0 - 2.5 (calc)   Total Bilirubin 0.3 0.2 - 1.2 mg/dL   Alkaline phosphatase (APISO) 54 31 - 125 U/L   AST 13 10 - 30 U/L   ALT 9 6 - 29 U/L  Lipid Profile  Result Value Ref Range   Cholesterol 161 <200 mg/dL   HDL 59 > OR = 50 mg/dL   Triglycerides 68 <440 mg/dL   LDL Cholesterol (Calc) 87 mg/dL (calc)   Total CHOL/HDL Ratio 2.7 <5.0 (calc)   Non-HDL Cholesterol (Calc) 102 <130 mg/dL (calc)  Thyroid Panel With TSH  Result Value Ref Range   T3 Uptake 31 22 - 35 %   T4, Total 11.1 5.1 - 11.9 mcg/dL   Free Thyroxine Index 3.4 1.4 - 3.8   TSH 2.27  mIU/L  Iron, TIBC and Ferritin Panel  Result Value Ref Range   Iron 26 (L) 40 - 190 mcg/dL   TIBC 161 (H) 096 - 045 mcg/dL (calc)   %SAT 5 (L) 16 - 45 % (calc)   Ferritin 4 (L) 16 - 154 ng/mL  TEST AUTHORIZATION  Result Value Ref Range   TEST NAME: IRON, TIBC AND FERRITIN PANEL    TEST CODE: [id number]    CLIENT CONTACT: DR Caralee Ates    REPORT ALWAYS MESSAGE SIGNATURE         Assessment & Plan:     ICD-10-CM   1. Multiple skin nodules  R22.9 CBC with Differential/Platelet    COMPLETE METABOLIC PANEL WITH GFR    ANA    C-reactive protein    Rheumatoid factor    Sedimentation rate    VITAMIN D 25 Hydroxy (Vit-D Deficiency, Fractures)    CK (Creatine Kinase)    2. Left hip pain  M25.552 CBC with Differential/Platelet    COMPLETE METABOLIC PANEL WITH GFR    ANA    C-reactive protein    Rheumatoid factor    Sedimentation rate    VITAMIN D 25 Hydroxy (Vit-D Deficiency, Fractures)    CK (Creatine Kinase)    Ambulatory referral to Orthopedic Surgery    3. Left leg pain  M79.605 CBC with Differential/Platelet    COMPLETE METABOLIC PANEL WITH GFR    ANA    C-reactive protein    Rheumatoid factor    Sedimentation rate    VITAMIN D 25 Hydroxy (Vit-D Deficiency, Fractures)    CK (Creatine Kinase)    4. Low back pain with left-sided sciatica, unspecified back pain laterality, unspecified chronicity  M54.42 Ambulatory referral to Orthopedic Surgery      Pt with multple joints issues with cutaneous nodules offered to do some labs No very bothersome constitutional/system sx associated Nodule to LLE - could do further eval with derm or [place] but it is so small, others have resolved on their own.  Labs today, and will wait and see if this one resolves or if pt will want referral or imaging for futher eval    Danelle Berry, [name] 06/23/23 10:09 AM

## 2023-06-23 NOTE — Patient Instructions (Signed)
You can also message [place] for referral if you would like to try a referral to dermatology or rheumatology  I do recommend getting rechecked if the bump on your leg changes or worsens

## 2023-06-24 LAB — COMPLETE METABOLIC PANEL WITH GFR
AG Ratio: 1.5 (calc) (ref 1.0–2.5)
ALT: 8 U/L (ref 6–29)
AST: 14 U/L (ref 10–30)
Albumin: 4.6 g/dL (ref 3.6–5.1)
Alkaline phosphatase (APISO): 66 U/L (ref 31–125)
BUN: 9 mg/dL (ref 7–25)
CO2: 28 mmol/L (ref 20–32)
Calcium: 9.2 mg/dL (ref 8.6–10.2)
Chloride: 103 mmol/L (ref 98–110)
Creat: 0.74 mg/dL (ref 0.50–0.97)
Globulin: 3.1 g/dL (ref 1.9–3.7)
Glucose, Bld: 90 mg/dL (ref 65–99)
Potassium: 4.5 mmol/L (ref 3.5–5.3)
Sodium: 139 mmol/L (ref 135–146)
Total Bilirubin: 0.2 mg/dL (ref 0.2–1.2)
Total Protein: 7.7 g/dL (ref 6.1–8.1)
eGFR: 111 mL/min/{1.73_m2} (ref >=60)

## 2023-06-24 LAB — CBC WITH DIFFERENTIAL/PLATELET
Absolute Monocytes: 308 {cells}/uL (ref 200–950)
Basophils Absolute: 69 {cells}/uL (ref 0–200)
Basophils Relative: 1.5 %
Eosinophils Absolute: 258 {cells}/uL (ref 15–500)
Eosinophils Relative: 5.6 %
HCT: 42.1 % (ref 35.0–45.0)
Hemoglobin: 13.7 g/dL (ref 11.7–15.5)
Lymphs Abs: 1734 {cells}/uL (ref 850–3900)
MCH: 26.8 pg — ABNORMAL LOW (ref 27.0–33.0)
MCHC: 32.5 g/dL (ref 32.0–36.0)
MCV: 82.2 fL (ref 80.0–100.0)
MPV: 10.3 fL (ref 7.5–12.5)
Monocytes Relative: 6.7 %
Neutro Abs: 2231 {cells}/uL (ref 1500–7800)
Neutrophils Relative %: 48.5 %
Platelets: 328 10*3/uL (ref 140–400)
RBC: 5.12 10*6/uL — ABNORMAL HIGH (ref 3.80–5.10)
RDW: 14 % (ref 11.0–15.0)
Total Lymphocyte: 37.7 %
WBC: 4.6 10*3/uL (ref 3.8–10.8)

## 2023-06-24 LAB — SEDIMENTATION RATE: Sed Rate: 11 mm/h (ref 0–20)

## 2023-06-24 LAB — RHEUMATOID FACTOR: Rheumatoid fact SerPl-aCnc: 18 [IU]/mL — ABNORMAL HIGH (ref <14)

## 2023-06-24 LAB — CK: Total CK: 80 U/L (ref 29–143)

## 2023-06-24 LAB — ANA: Anti Nuclear Antibody (ANA): NEGATIVE

## 2023-06-24 LAB — C-REACTIVE PROTEIN: CRP: <3 mg/L (ref <8.0)

## 2023-06-24 LAB — VITAMIN D 25 HYDROXY (VIT D DEFICIENCY, FRACTURES): Vit D, 25-Hydroxy: 32 ng/mL (ref 30–100)

## 2023-07-06 DIAGNOSIS — M5416 Radiculopathy, lumbar region: Secondary | ICD-10-CM | POA: Diagnosis not present

## 2023-07-30 DIAGNOSIS — M5416 Radiculopathy, lumbar region: Secondary | ICD-10-CM | POA: Diagnosis not present

## 2024-01-12 DIAGNOSIS — H5213 Myopia, bilateral: Secondary | ICD-10-CM | POA: Diagnosis not present

## 2024-02-22 ENCOUNTER — Encounter: Payer: Self-pay | Admitting: Internal Medicine

## 2024-02-22 ENCOUNTER — Other Ambulatory Visit (HOSPITAL_COMMUNITY)
Admission: RE | Admit: 2024-02-22 | Discharge: 2024-02-22 | Disposition: A | Discharge status: Home / Self Care | Source: Ambulatory Visit | Attending: Internal Medicine | Admitting: Internal Medicine

## 2024-02-22 ENCOUNTER — Other Ambulatory Visit: Payer: Self-pay

## 2024-02-22 ENCOUNTER — Ambulatory Visit (INDEPENDENT_AMBULATORY_CARE_PROVIDER_SITE_OTHER): Payer: Self-pay | Admitting: Internal Medicine

## 2024-02-22 VITALS — BP 110/70 | HR 100 | Temp 98.0°F | Resp 16 | Ht 62.0 in | Wt 127.3 lb

## 2024-02-22 DIAGNOSIS — Z124 Encounter for screening for malignant neoplasm of cervix: Secondary | ICD-10-CM

## 2024-02-22 DIAGNOSIS — Z0000 Encounter for general adult medical examination without abnormal findings: Secondary | ICD-10-CM

## 2024-02-22 DIAGNOSIS — Z13220 Encounter for screening for lipoid disorders: Secondary | ICD-10-CM | POA: Diagnosis not present

## 2024-02-22 NOTE — Progress Notes (Signed)
 Name: Abigail Lowe   MRN: [id number]    DOB: 03-Mar-1991   Date:02/22/2024       Progress Note  Subjective  Chief Complaint  Chief Complaint  Patient presents with   Annual Exam    HPI  Patient presents for annual CPE.  Diet: Regular Exercise: 4 days 30 minutes  Last Eye Exam: completed Last Dental Exam: completed  Flowsheet Row Office Visit from 02/22/2024 in [facility]  AUDIT-C Score 0      Depression: Phq 9 is  negative    02/22/2024   10:17 AM 06/23/2023    9:51 AM 04/27/2023    8:23 AM 02/19/2023   10:15 AM 03/23/2018    2:42 PM  Depression screen PHQ 2/9  Decreased Interest 0 0 0 0 0  Down, Depressed, Hopeless 0 0 0 1 0  PHQ - 2 Score 0 0 0 1 0  Altered sleeping  0 0 0   Tired, decreased energy  0 0 0   Change in appetite  0 0 0   Feeling bad or failure about yourself   0 0 0   Trouble concentrating  0 0 0   Moving slowly or fidgety/restless  0 0 0   Suicidal thoughts  0 0 0   PHQ-9 Score  0 0 1   Difficult doing work/chores  Not difficult at all Not difficult at all Not difficult at all    Hypertension: BP Readings from Last 3 Encounters:  02/22/24 110/70  06/23/23 114/72  04/27/23 118/74   Obesity: Wt Readings from Last 3 Encounters:  02/22/24 127 lb 4.8 oz (57.7 kg)  06/23/23 133 lb 12.8 oz (60.7 kg)  04/27/23 134 lb 9.6 oz (61.1 kg)   BMI Readings from Last 3 Encounters:  02/22/24 23.28 kg/m  06/23/23 24.47 kg/m  04/27/23 24.62 kg/m     Vaccines: reviewed with the patient.   Hep C Screening: completed STD testing and prevention (HIV/chl/gon/syphilis): no concerns  Intimate partner violence: negative screen  Menstrual History/LMP/Abnormal Bleeding: LMP 02/02/2024; periods are regular; history of tubal ligation  Discussed importance of follow up if any post-menopausal bleeding: not applicable  Incontinence Symptoms: negative for symptoms   Breast cancer:  - Last Mammogram: NA - discussed starting screening at age  29  Osteoporosis Prevention : Discussed high calcium and vitamin D  supplementation, weight bearing exercises Bone density :not applicable   Cervical cancer screening: performing today  Skin cancer: Discussed monitoring for atypical lesions  Colorectal cancer: NA, discussed starting screening at age 37 Lung cancer:  Low Dose CT Chest recommended if Age 20-80 years, 20 pack-year currently smoking OR have quit w/in 15years. Patient does not qualify for screen   ECG: 06/06/22  Advanced Care Planning: A voluntary discussion about advance care planning including the explanation and discussion of advance directives.  Discussed health care proxy and Living will, and the patient was able to identify a health care proxy as Micheal Hanratty (husband).  Patient does not have a living will and power of attorney of health care   Patient Active Problem List   Diagnosis Date Noted   Hypothyroidism (acquired) 08/31/2020   Encounter for sterilization 07/18/2020   Encounter for care or examination of lactating mother 07/18/2020   Postpartum care following vaginal delivery 07/18/2020   Encounter for planned induction of labor 07/17/2020   [redacted] weeks gestation of pregnancy 07/17/2020   Near syncope 06/06/2020   Shortness of breath 06/06/2020   Palpitations 06/06/2020  Orthostatic hypotension 06/01/2020   Supervision of other normal pregnancy, antepartum 11/30/2019   Hypothyroidism affecting pregnancy, antepartum 11/30/2019   Depression 12/18/2017   Generalized anxiety disorder 12/18/2017    Past Surgical History:  Procedure Laterality Date   CHOLECYSTECTOMY N/A 11/25/2016   Procedure: LAPAROSCOPIC CHOLECYSTECTOMY WITH INTRAOPERATIVE CHOLANGIOGRAM;  Surgeon: Dareen Ebbing, MD;  Location: [facility] OR;  Service: General;  Laterality: N/A;   DILATION AND CURETTAGE OF UTERUS  11/2018   SAB   INTRAUTERINE DEVICE (IUD) INSERTION     TUBAL LIGATION Bilateral 07/18/2020   Procedure: POST PARTUM TUBAL LIGATION;   Surgeon: Kris Pester, MD;  Location: [facility] ORS;  Service: Gynecology;  Laterality: Bilateral;    Family History  Problem Relation Age of Onset   Hypertension Father    Hyperlipidemia Father    Heart disease Maternal Grandfather    Cancer Maternal Grandfather    Cervical cancer Mother    ADD / ADHD Brother    Hypertension Maternal Grandmother    Cervical cancer Maternal Grandmother    Cancer Paternal Grandfather    Diabetes Paternal Grandfather    Hyperlipidemia Paternal Grandfather     Social History   Socioeconomic History   Marital status: Married    Spouse name: Aleta Anda   Number of children: 2   Years of education: Not on file   Highest education level: Associate degree: occupational, Scientist, product/process development, or vocational program  Occupational History   Not on file  Tobacco Use   Smoking status: Never   Smokeless tobacco: Never  Vaping Use   Vaping status: Never Used  Substance and Sexual Activity   Alcohol use: No   Drug use: No   Sexual activity: Yes    Birth control/protection: Surgical, None  Other Topics Concern   Not on file  Social History Narrative   Live with husband Aleta Anda and 2 children in [place].    Social Drivers of Corporate investment banker Strain: Low Risk  (02/19/2024)   Overall Financial Resource Strain (CARDIA)    Difficulty of Paying Living Expenses: Not hard at all  Food Insecurity: No Food Insecurity (02/19/2024)   Hunger Vital Sign    Worried About Running Out of Food in the Last Year: Never true    Ran Out of Food in the Last Year: Never true  Transportation Needs: No Transportation Needs (02/19/2024)   PRAPARE - Administrator, Civil Service (Medical): No    Lack of Transportation (Non-Medical): No  Physical Activity: Insufficiently Active (02/22/2024)   Exercise Vital Sign    Days of Exercise per Week: 4 days    Minutes of Exercise per Session: 30 min  Stress: Stress Concern Present (02/19/2024)   [employer] of Occupational  Health - Occupational Stress Questionnaire    Feeling of Stress : To some extent  Social Connections: Socially Integrated (02/19/2024)   Social Connection and Isolation Panel [NHANES]    Frequency of Communication with Friends and Family: More than three times a week    Frequency of Social Gatherings with Friends and Family: Twice a week    Attends Religious Services: More than 4 times per year    Active Member of [employer] or Organizations: Yes    Attends Engineer, structural: More than 4 times per year    Marital Status: Married  Catering manager Violence: Not At Risk (02/22/2024)   Humiliation, Afraid, Rape, and Kick questionnaire    Fear of Current or Ex-Partner: No    Emotionally Abused:  No    Physically Abused: No    Sexually Abused: No     Current Outpatient Medications:    ferrous gluconate  (FERGON) 324 MG tablet, Take 1 tablet (324 mg total) by mouth daily., Disp: 90 tablet, Rfl: 3   predniSONE  (DELTASONE ) 20 MG tablet, 2 tabs poqday 1-3, 1 tabs poqday 4-6 (Patient not taking: Reported on 02/22/2024), Disp: 9 tablet, Rfl: 0   valACYclovir  (VALTREX ) 500 MG tablet, Take 1 tablet (500 mg total) by mouth daily as needed (cold sore). (Patient not taking: Reported on 02/22/2024), Disp: 30 tablet, Rfl: 1  No Known Allergies   Review of Systems  All other systems reviewed and are negative.   Objective  Vitals:   02/22/24 1020  BP: 110/70  Pulse: 100  Resp: 16  Temp: 98 F (36.7 C)  TempSrc: Oral  SpO2: 97%  Weight: 127 lb 4.8 oz (57.7 kg)  Height: 5\' 2"  (1.575 m)    Body mass index is 23.28 kg/m.  Physical Exam Exam conducted with a chaperone present.  Constitutional:      Appearance: Normal appearance.  HENT:     Head: Normocephalic and atraumatic.     Right Ear: Tympanic membrane, ear canal and external ear normal.     Left Ear: Tympanic membrane, ear canal and external ear normal.  Eyes:     Conjunctiva/sclera: Conjunctivae normal.  Cardiovascular:      Rate and Rhythm: Normal rate and regular rhythm.  Pulmonary:     Effort: Pulmonary effort is normal.     Breath sounds: Normal breath sounds.  Chest:  Breasts:    Right: Normal.     Left: Normal.  Genitourinary:    Comments: External genitalia within normal limits.  Vaginal mucosa pink, moist, normal rugae.  Nonfriable cervix without lesions, no discharge or bleeding noted on speculum exam.  Bimanual exam revealed normal, nongravid uterus.  No cervical motion tenderness. No adnexal masses bilaterally.    Lymphadenopathy:     Upper Body:     Right upper body: No supraclavicular, axillary or pectoral adenopathy.     Left upper body: No supraclavicular, axillary or pectoral adenopathy.  Skin:    General: Skin is warm and dry.  Neurological:     General: No focal deficit present.     Mental Status: She is alert. Mental status is at baseline.  Psychiatric:        Mood and Affect: Mood normal.        Behavior: Behavior normal.     Last CBC Lab Results  Component Value Date   WBC 4.6 06/23/2023   HGB 13.7 06/23/2023   HCT 42.1 06/23/2023   MCV 82.2 06/23/2023   MCH 26.8 (L) 06/23/2023   RDW 14.0 06/23/2023   PLT 328 06/23/2023   Last metabolic panel Lab Results  Component Value Date   GLUCOSE 90 06/23/2023   NA 139 06/23/2023   K 4.5 06/23/2023   CL 103 06/23/2023   CO2 28 06/23/2023   BUN 9 06/23/2023   CREATININE 0.74 06/23/2023   EGFR 111 06/23/2023   CALCIUM 9.2 06/23/2023   PROT 7.7 06/23/2023   ALBUMIN 3.2 (L) 03/23/2020   LABGLOB 2.9 02/08/2018   AGRATIO 1.6 02/08/2018   BILITOT 0.2 06/23/2023   ALKPHOS 88 03/23/2020   AST 14 06/23/2023   ALT 8 06/23/2023   ANIONGAP 5 06/06/2022   Last lipids Lab Results  Component Value Date   CHOL 161 02/19/2023   HDL 59 02/19/2023  LDLCALC 87 02/19/2023   TRIG 68 02/19/2023   CHOLHDL 2.7 02/19/2023   Last hemoglobin A1c Lab Results  Component Value Date   HGBA1C 5.3 11/30/2019   Last thyroid  functions Lab  Results  Component Value Date   TSH 2.27 02/19/2023   T4TOTAL 11.1 02/19/2023   Last vitamin D  Lab Results  Component Value Date   VD25OH 32 06/23/2023   Last vitamin B12 and Folate No results found for: "VITAMINB12", "FOLATE"    Assessment & Plan  1. Annual physical exam (Primary)/Lipid screening: Physical exam completed, health maintenance reviewed and annual labs ordered.   - Cytology - PAP - CBC w/Diff/Platelet - COMPLETE METABOLIC PANEL WITHOUT GFR - Lipid Profile  2. Cervical cancer screening: Pap performed today.  - Cytology - PAP   -USPSTF grade A and B recommendations reviewed with patient; age-appropriate recommendations, preventive care, screening tests, etc discussed and encouraged; healthy living encouraged; see AVS for patient education given to patient -Discussed importance of 150 minutes of physical activity weekly, eat two servings of fish weekly, eat one serving of tree nuts ( cashews, pistachios, pecans, almonds.Aaron Aas) every other day, eat 6 servings of fruit/vegetables daily and drink plenty of water and avoid sweet beverages.   -Reviewed Health Maintenance: Yes.

## 2024-02-23 ENCOUNTER — Encounter: Payer: Self-pay | Admitting: Internal Medicine

## 2024-02-23 LAB — CBC WITH DIFFERENTIAL/PLATELET
Absolute Lymphocytes: 1813 {cells}/uL (ref 850–3900)
Absolute Monocytes: 365 {cells}/uL (ref 200–950)
Basophils Absolute: 68 {cells}/uL (ref 0–200)
Basophils Relative: 1.2 %
Eosinophils Absolute: 194 {cells}/uL (ref 15–500)
Eosinophils Relative: 3.4 %
HCT: 43.4 % (ref 35.0–45.0)
Hemoglobin: 15.3 g/dL (ref 11.7–15.5)
MCH: 30.4 pg (ref 27.0–33.0)
MCHC: 35.3 g/dL (ref 32.0–36.0)
MCV: 86.1 fL (ref 80.0–100.0)
MPV: 10.1 fL (ref 7.5–12.5)
Monocytes Relative: 6.4 %
Neutro Abs: 3260 {cells}/uL (ref 1500–7800)
Neutrophils Relative %: 57.2 %
Platelets: 307 10*3/uL (ref 140–400)
RBC: 5.04 10*6/uL (ref 3.80–5.10)
RDW: 12.2 % (ref 11.0–15.0)
Total Lymphocyte: 31.8 %
WBC: 5.7 10*3/uL (ref 3.8–10.8)

## 2024-02-23 LAB — COMPLETE METABOLIC PANEL WITHOUT GFR
AG Ratio: 1.5 (calc) (ref 1.0–2.5)
ALT: 8 U/L (ref 6–29)
AST: 14 U/L (ref 10–30)
Albumin: 4.5 g/dL (ref 3.6–5.1)
Alkaline phosphatase (APISO): 56 U/L (ref 31–125)
BUN: 8 mg/dL (ref 7–25)
CO2: 28 mmol/L (ref 20–32)
Calcium: 9.6 mg/dL (ref 8.6–10.2)
Chloride: 103 mmol/L (ref 98–110)
Creat: 0.7 mg/dL (ref 0.50–0.97)
Globulin: 3 g/dL (ref 1.9–3.7)
Glucose, Bld: 87 mg/dL (ref 65–99)
Potassium: 4.9 mmol/L (ref 3.5–5.3)
Sodium: 138 mmol/L (ref 135–146)
Total Bilirubin: 0.6 mg/dL (ref 0.2–1.2)
Total Protein: 7.5 g/dL (ref 6.1–8.1)

## 2024-02-23 LAB — CYTOLOGY - PAP
Adequacy: ABSENT
Comment: NEGATIVE
Diagnosis: NEGATIVE
High risk HPV: NEGATIVE

## 2024-02-23 LAB — LIPID PANEL
Cholesterol: 161 mg/dL (ref <200)
HDL: 57 mg/dL (ref >=50)
LDL Cholesterol (Calc): 90 mg/dL
Non-HDL Cholesterol (Calc): 104 mg/dL (ref <130)
Total CHOL/HDL Ratio: 2.8 (calc) (ref <5.0)
Triglycerides: 57 mg/dL (ref <150)

## 2024-02-29 ENCOUNTER — Encounter: Payer: Self-pay | Admitting: Internal Medicine

## 2024-02-29 ENCOUNTER — Other Ambulatory Visit: Payer: Self-pay

## 2024-02-29 ENCOUNTER — Ambulatory Visit (INDEPENDENT_AMBULATORY_CARE_PROVIDER_SITE_OTHER): Admitting: Internal Medicine

## 2024-02-29 VITALS — BP 120/72 | HR 86 | Resp 16 | Ht 62.0 in | Wt 127.0 lb

## 2024-02-29 DIAGNOSIS — K5904 Chronic idiopathic constipation: Secondary | ICD-10-CM | POA: Diagnosis not present

## 2024-02-29 DIAGNOSIS — K625 Hemorrhage of anus and rectum: Secondary | ICD-10-CM | POA: Diagnosis not present

## 2024-02-29 MED ORDER — LINACLOTIDE 145 MCG PO CAPS
145.0000 ug | ORAL_CAPSULE | Freq: Every day | ORAL | 0 refills | Status: AC
Start: 2024-02-29 — End: ?

## 2024-02-29 NOTE — Progress Notes (Signed)
   Acute Office Visit  Subjective:     Patient ID: Abigail Lowe, female    DOB: 03-24-1991, 33 y.o.   MRN: [id number]  Chief Complaint  Patient presents with   Rectal Bleeding    Rectal Bleeding  Pertinent negatives include no fever, no abdominal pain and no diarrhea.   Patient is in today for rectal bleeding.   Discussed the use of AI scribe software for clinical note transcription with the patient, who gave verbal consent to proceed.  History of Present Illness Abigail Lowe is a 33 year old female who presents with rectal bleeding and constipation.  Rectal bleeding has been present for three weeks, with blood in the toilet bowl and on tissue paper, sometimes with small clots. Bleeding is more noticeable after infrequent and difficult bowel movements.  She has had approximately three bowel movements in the past three weeks, using over-the-counter colon detox cleanse pills for over a year to stimulate bowel movements. Linzess was previously tried but was ineffective unless taken in double doses, which only occasionally worked.  Bowel movements are small, dry, and pebbly. She experiences bloating without regular bowel movements, despite consuming large meals. There is a history of external hemorrhoids during pregnancy, but no significant recent issues.    Review of Systems  Constitutional:  Negative for chills and fever.  Gastrointestinal:  Positive for blood in stool, constipation and hematochezia. Negative for abdominal pain, diarrhea and melena.        Objective:    BP 120/72 (Cuff Size: Normal)   Pulse 86   Resp 16   Ht 5\' 2"  (1.575 m)   Wt 127 lb (57.6 kg)   LMP 02/03/2024   SpO2 99%   BMI 23.23 kg/m    Physical Exam Exam conducted with a chaperone present.  Constitutional:      Appearance: Normal appearance.  HENT:     Head: Normocephalic and atraumatic.  Eyes:     Conjunctiva/sclera: Conjunctivae normal.  Cardiovascular:     Rate and Rhythm: Normal  rate and regular rhythm.  Pulmonary:     Effort: Pulmonary effort is normal.     Breath sounds: Normal breath sounds.  Abdominal:     Comments: 1 small external hemorrhoid, 2 moderate internal hemorrhoids  Neurological:     General: No focal deficit present.     Mental Status: She is alert. Mental status is at baseline.  Psychiatric:        Mood and Affect: Mood normal.        Behavior: Behavior normal.     No results found for any visits on 02/29/24.      Assessment & Plan:   Assessment & Plan Rectal bleeding Bleeding likely due to internal hemorrhoids. Absence of pain suggests no anal fissure. Physical exam consistent with hemorrhoids.  Constipation Chronic constipation with infrequent, dry stools contributing to bleeding. Previous low-dose Linzess ineffective. Align probiotic recommended for gut health. Linzess dosage adjustment considered. - Recommend daily probiotic, specifically Align, to improve gut health. - Increase fiber intake to bulk stools. - Prescribe medium dose of Linzess to improve bowel regularity. - Follow up in a month to assess effectiveness and adjust Linzess dosage if necessary.  - linaclotide (LINZESS) 145 MCG CAPS capsule; Take 1 capsule (145 mcg total) by mouth daily before breakfast.  Dispense: 30 capsule; Refill: 0  Return in about 4 weeks (around 03/28/2024).  Rockney Cid, DO

## 2024-04-04 ENCOUNTER — Ambulatory Visit: Admitting: Internal Medicine

## 2025-02-22 ENCOUNTER — Encounter: Admitting: Internal Medicine
